# Patient Record
Sex: Female | Born: 1960 | Race: Black or African American | Hispanic: No | Marital: Married | Smoking: Never smoker
Health system: Southern US, Community
[De-identification: ages and names within clinical notes are randomized; demographics above are authoritative.]

## PROBLEM LIST (undated history)

## (undated) DIAGNOSIS — M1990 Unspecified osteoarthritis, unspecified site: Secondary | ICD-10-CM

## (undated) DIAGNOSIS — I10 Essential (primary) hypertension: Secondary | ICD-10-CM

## (undated) DIAGNOSIS — K219 Gastro-esophageal reflux disease without esophagitis: Secondary | ICD-10-CM

## (undated) DIAGNOSIS — E785 Hyperlipidemia, unspecified: Secondary | ICD-10-CM

## (undated) DIAGNOSIS — J302 Other seasonal allergic rhinitis: Secondary | ICD-10-CM

## (undated) HISTORY — DX: Unspecified osteoarthritis, unspecified site: M19.90

## (undated) HISTORY — PX: BARTHOLIN GLAND CYST EXCISION: SHX565

## (undated) HISTORY — PX: OTHER SURGICAL HISTORY: SHX169

## (undated) HISTORY — DX: Hyperlipidemia, unspecified: E78.5

---

## 1982-06-02 HISTORY — PX: CERVICAL POLYPECTOMY: SHX88

## 2003-09-28 ENCOUNTER — Other Ambulatory Visit: Admission: RE | Admit: 2003-09-28 | Discharge: 2003-09-28 | Payer: Self-pay | Admitting: *Deleted

## 2004-10-01 ENCOUNTER — Other Ambulatory Visit: Admission: RE | Admit: 2004-10-01 | Discharge: 2004-10-01 | Payer: Self-pay | Admitting: *Deleted

## 2004-11-01 ENCOUNTER — Encounter: Admission: RE | Admit: 2004-11-01 | Discharge: 2004-11-01 | Payer: Self-pay | Admitting: *Deleted

## 2005-10-06 ENCOUNTER — Other Ambulatory Visit: Admission: RE | Admit: 2005-10-06 | Discharge: 2005-10-06 | Payer: Self-pay | Admitting: Obstetrics & Gynecology

## 2005-12-10 ENCOUNTER — Encounter: Admission: RE | Admit: 2005-12-10 | Discharge: 2005-12-10 | Payer: Self-pay | Admitting: Obstetrics & Gynecology

## 2005-12-23 ENCOUNTER — Encounter: Admission: RE | Admit: 2005-12-23 | Discharge: 2005-12-23 | Payer: Self-pay | Admitting: Obstetrics & Gynecology

## 2007-03-01 ENCOUNTER — Encounter: Admission: RE | Admit: 2007-03-01 | Discharge: 2007-03-01 | Payer: Self-pay | Admitting: Obstetrics & Gynecology

## 2007-03-29 ENCOUNTER — Other Ambulatory Visit: Admission: RE | Admit: 2007-03-29 | Discharge: 2007-03-29 | Payer: Self-pay | Admitting: Obstetrics and Gynecology

## 2008-03-06 ENCOUNTER — Encounter: Admission: RE | Admit: 2008-03-06 | Discharge: 2008-03-06 | Payer: Self-pay | Admitting: Obstetrics & Gynecology

## 2008-04-14 ENCOUNTER — Other Ambulatory Visit: Admission: RE | Admit: 2008-04-14 | Discharge: 2008-04-14 | Payer: Self-pay | Admitting: Obstetrics and Gynecology

## 2009-04-11 ENCOUNTER — Encounter: Admission: RE | Admit: 2009-04-11 | Discharge: 2009-04-11 | Payer: Self-pay | Admitting: Obstetrics & Gynecology

## 2010-04-15 ENCOUNTER — Encounter: Admission: RE | Admit: 2010-04-15 | Discharge: 2010-04-15 | Payer: Self-pay | Admitting: Obstetrics & Gynecology

## 2011-04-21 ENCOUNTER — Other Ambulatory Visit: Payer: Self-pay | Admitting: Obstetrics & Gynecology

## 2011-04-21 DIAGNOSIS — Z1231 Encounter for screening mammogram for malignant neoplasm of breast: Secondary | ICD-10-CM

## 2011-04-28 ENCOUNTER — Ambulatory Visit
Admission: RE | Admit: 2011-04-28 | Discharge: 2011-04-28 | Disposition: A | Discharge status: Home / Self Care | Payer: BC Managed Care – PPO | Source: Ambulatory Visit | Attending: Obstetrics & Gynecology | Admitting: Obstetrics & Gynecology

## 2011-04-28 DIAGNOSIS — Z1231 Encounter for screening mammogram for malignant neoplasm of breast: Secondary | ICD-10-CM

## 2011-05-07 ENCOUNTER — Encounter (INDEPENDENT_AMBULATORY_CARE_PROVIDER_SITE_OTHER): Payer: BC Managed Care – PPO | Admitting: Internal Medicine

## 2011-05-07 ENCOUNTER — Other Ambulatory Visit (INDEPENDENT_AMBULATORY_CARE_PROVIDER_SITE_OTHER): Payer: BC Managed Care – PPO

## 2011-05-07 DIAGNOSIS — I10 Essential (primary) hypertension: Secondary | ICD-10-CM

## 2011-05-07 DIAGNOSIS — E7800 Pure hypercholesterolemia, unspecified: Secondary | ICD-10-CM

## 2011-05-07 DIAGNOSIS — Z0000 Encounter for general adult medical examination without abnormal findings: Secondary | ICD-10-CM

## 2011-09-24 ENCOUNTER — Encounter: Payer: Self-pay | Admitting: Internal Medicine

## 2011-09-24 ENCOUNTER — Ambulatory Visit (INDEPENDENT_AMBULATORY_CARE_PROVIDER_SITE_OTHER): Payer: BC Managed Care – PPO | Admitting: Internal Medicine

## 2011-09-24 VITALS — BP 140/79 | HR 63 | Temp 97.8°F | Resp 16 | Ht 65.5 in | Wt 162.4 lb

## 2011-09-24 DIAGNOSIS — E785 Hyperlipidemia, unspecified: Secondary | ICD-10-CM

## 2011-09-24 DIAGNOSIS — I10 Essential (primary) hypertension: Secondary | ICD-10-CM

## 2011-09-24 LAB — LIPID PANEL
HDL: 54 mg/dL (ref >39)
LDL Cholesterol: 125 mg/dL — ABNORMAL HIGH (ref 0–99)

## 2011-09-24 LAB — C-REACTIVE PROTEIN: CRP: 0.46 mg/dL (ref <0.60)

## 2011-09-24 NOTE — Progress Notes (Signed)
  Subjective:    Patient ID: Valerie Jenkins, female    DOB: 1961/01/13, 51 y.o.   MRN: [id number]  HPIFollowup from December 2012 Pravachol was discontinued to see if myalgias and joint pains would go away and they did She is here today to assess lipids and inflammatory markers so we can decide if we try a second medicine She still has not lost significant weight Her eating habits are poor She doesn't exercise at all She has recently had this which responded to increasing her Zyrtec and taking Benadryl at bedtime    Review of Systems     Objective:   Physical Exam Vital signs are stable        Assessment & Plan:  Problem #1 hyperlipidemia Problem #2 hypertension Problem #3 myalgias from Pravachol  Check lipid profile and CPK and send letter with plan She asked about the necessity for stress test and I suggested we do this only if she begins to have easy fatigability although her exercise profile may be too low to assess this/she does have a family history of heart disease

## 2011-09-25 ENCOUNTER — Encounter: Payer: Self-pay | Admitting: Internal Medicine

## 2011-10-20 ENCOUNTER — Telehealth: Payer: Self-pay

## 2011-10-20 NOTE — Telephone Encounter (Signed)
PT STATES SHE HAD LAB WORK DONE FOR DR DOOLITTLE TO SEE WHETHER HER CHOL MEDS WERE GOING TO BE CHANGED OR ADJUSTED. SHE USUALLY GETS A LETTER IN THE MAIL BUT SHE HASN'T HEARD FROM ANYONE. PLEASE CALL [phone]

## 2011-10-20 NOTE — Telephone Encounter (Signed)
See the letter dictated on 09/25/2011 about her labs/ it was sent to her home I HOPE

## 2011-10-20 NOTE — Telephone Encounter (Signed)
Dr. Merla Riches I do not see where there were any comments on patients labs. Please advise

## 2011-10-21 NOTE — Telephone Encounter (Signed)
FYI: sorry in didn't look in letters for those results. patietn notified and did say she received the letter but since you physically write on it she didn't know if you reviewed them. patient stated she was going to schedule follow up appt with you for 3 months and also she wanted to think about the exercise tolerance test and get back to you.

## 2011-11-22 ENCOUNTER — Encounter: Payer: Self-pay | Admitting: Emergency Medicine

## 2011-11-22 ENCOUNTER — Emergency Department (INDEPENDENT_AMBULATORY_CARE_PROVIDER_SITE_OTHER)
Admission: EM | Admit: 2011-11-22 | Discharge: 2011-11-22 | Disposition: A | Discharge status: Home / Self Care | Payer: BC Managed Care – PPO | Source: Home / Self Care | Attending: Family Medicine | Admitting: Family Medicine

## 2011-11-22 DIAGNOSIS — J069 Acute upper respiratory infection, unspecified: Secondary | ICD-10-CM

## 2011-11-22 HISTORY — DX: Essential (primary) hypertension: I10

## 2011-11-22 HISTORY — DX: Other seasonal allergic rhinitis: J30.2

## 2011-11-22 HISTORY — DX: Gastro-esophageal reflux disease without esophagitis: K21.9

## 2011-11-22 MED ORDER — SULFAMETHOXAZOLE-TRIMETHOPRIM 800-160 MG PO TABS: 1.0000 | ORAL_TABLET | Freq: Two times a day (BID) | ORAL | Status: AC

## 2011-11-22 MED ORDER — BENZONATATE 200 MG PO CAPS: 200.0000 mg | ORAL_CAPSULE | Freq: Every day | ORAL | Status: AC

## 2011-11-22 NOTE — ED Provider Notes (Signed)
History     CSN: [id number]  Arrival date & time 11/22/11  1342   First MD Initiated Contact with Patient 11/22/11 1432      Chief Complaint  Patient presents with  . Cough  . Nasal Congestion     HPI Comments: Patient complains of approximately 5 day history of gradually progressive URI symptoms beginning with a mild sore throat (now improved), followed by progressive nasal congestion.  A cough started about 4 days ago.  Complains of fatigue and initial myalgias.  Cough is now worse at night and generally non-productive during the day.  There has been no pleuritic pain or shortness of breath, but she does wheeze at times.  She now occasionally coughs until she gags.  She does not remember her last tetanus immunization.  The history is provided by the patient.    Past Medical History  Diagnosis Date  . Hypertension   . Seasonal allergies   . GERD (gastroesophageal reflux disease)     Past Surgical History  Procedure Date  . Cesarean section   . Bartholin cystectomy     Family History  Problem Relation Age of Onset  . Diabetes Mother   . Hypertension Mother   . Hyperlipidemia Mother   . Diabetes Father   . Hypertension Father   . Hyperlipidemia Father   . Stroke Father   . Hypertension Sister     History  Substance Use Topics  . Smoking status: Never Smoker   . Smokeless tobacco: Not on file  . Alcohol Use: Yes    OB History    Grav Para Term Preterm Abortions TAB SAB Ect Mult Living                  Review of Systems + sore throat, improved + cough No pleuritic pain + wheezing + nasal congestion + post-nasal drainage No sinus pain/pressure No itchy/red eyes No earache No hemoptysis No SOB No fever, + chills No nausea No vomiting No abdominal pain No diarrhea No urinary symptoms No skin rashes + fatigue + myalgias + headache Used OTC meds without relief (Mucinex DM) Allergies  Flexeril; Peanut-containing drug products; and Shellfish  allergy  Home Medications   Current Outpatient Rx  Name Route Sig Dispense Refill  . AZELASTINE HCL 0.05 % OP SOLN  1 drop 2 (two) times daily.    Marland Kitchen BENZONATATE 200 MG PO CAPS Oral Take 1 capsule (200 mg total) by mouth at bedtime. Take as needed for cough 12 capsule 0  . ESOMEPRAZOLE MAGNESIUM 40 MG PO CPDR Oral Take 40 mg by mouth daily before breakfast.    . LOSARTAN POTASSIUM-HCTZ 50-12.5 MG PO TABS Oral Take 1 tablet by mouth daily.    Marland Kitchen MONTELUKAST SODIUM 10 MG PO TABS Oral Take 10 mg by mouth at bedtime.    . SULFAMETHOXAZOLE-TRIMETHOPRIM 800-160 MG PO TABS Oral Take 1 tablet by mouth 2 (two) times daily. 14 tablet 0  . VITAMIN D (ERGOCALCIFEROL) 50000 UNITS PO CAPS Oral Take 50,000 Units by mouth.      BP 109/70  Pulse 110  Temp 98.3 F (36.8 C) (Oral)  Resp 18  Ht 5\' 5"  (1.651 m)  Wt 158 lb (71.668 kg)  BMI 26.29 kg/m2  SpO2 99%  Physical Exam Nursing notes and Vital Signs reviewed. Appearance:  Patient appears healthy, stated age, and in no acute distress Eyes:  Pupils are equal, round, and reactive to light and accomodation.  Extraocular movement is intact.  Conjunctivae  are not inflamed  Ears:  Canals normal.  Tympanic membranes normal.  Nose:  Mildly congested turbinates.  No sinus tenderness.  Pharynx:  Normal Neck:  Supple.  Slightly tender shotty anterior/posterior nodes are palpated bilaterally  Lungs:  Clear to auscultation.  Breath sounds are equal. Chest:  Tenderness to palpation over the mid-sternum.   Heart:  Regular rate and rhythm without murmurs, rubs, or gallops.  Abdomen:  Nontender without masses or hepatosplenomegaly.  Bowel sounds are present.  No CVA or flank tenderness.  Extremities:  No edema.  No calf tenderness Skin:  No rash present.   ED Course  Procedures  none      1. Acute upper respiratory infections of unspecified site;  ? pertussis      MDM  Begin Septra DS for one week.  Prescription written for Benzonatate [facility]) to  take at bedtime for night-time cough.  Take plain Mucinex (guaifenesin) twice daily for cough and congestion.  Increase fluid intake, rest. Also recommend using saline nasal spray several times daily and saline nasal irrigation (AYR is a common brand) Stop all antihistamines for now, and other non-prescription cough/cold preparations.  Continue Singulair. Recommend a Tdap when well.  Follow-up with family doctor if not improving 7 to 10 days.         Lattie Haw, MD 11/22/11 [phone]

## 2011-11-22 NOTE — ED Notes (Signed)
Congestion and cough x 6 days; husband had same S&S prior to her onset.

## 2011-11-22 NOTE — Discharge Instructions (Signed)
Take plain Mucinex (guaifenesin) twice daily for cough and congestion.  Increase fluid intake, rest. Also recommend using saline nasal spray several times daily and saline nasal irrigation (AYR is a common brand) Stop all antihistamines for now, and other non-prescription cough/cold preparations.  Continue Singulair. Recommend a Tdap when well.  Follow-up with family doctor if not improving 7 to 10 days.

## 2011-12-24 ENCOUNTER — Ambulatory Visit: Payer: BC Managed Care – PPO | Admitting: Internal Medicine

## 2012-02-18 ENCOUNTER — Ambulatory Visit: Payer: BC Managed Care – PPO | Admitting: Internal Medicine

## 2012-04-05 ENCOUNTER — Telehealth: Payer: Self-pay

## 2012-04-05 NOTE — Telephone Encounter (Signed)
Faxed over the requested information to patient's employer.  The only immunization that we have on file for the patient is a TDAP.

## 2012-04-05 NOTE — Telephone Encounter (Signed)
Pt states she needs her immunization records sent for a new job. She is not sure if we have any copies of them. If we do she asks [place] to fax them to HR at the employer is getting a job with.   [facility] HR: Charolette Child   Phone [phone]   Fax - [phone]

## 2012-04-14 ENCOUNTER — Encounter: Payer: Self-pay | Admitting: Internal Medicine

## 2012-04-14 ENCOUNTER — Ambulatory Visit (INDEPENDENT_AMBULATORY_CARE_PROVIDER_SITE_OTHER): Payer: BC Managed Care – PPO | Admitting: Internal Medicine

## 2012-04-14 VITALS — BP 130/80 | HR 52 | Temp 97.9°F | Resp 16 | Ht 65.5 in | Wt 161.6 lb

## 2012-04-14 DIAGNOSIS — M170 Bilateral primary osteoarthritis of knee: Secondary | ICD-10-CM

## 2012-04-14 DIAGNOSIS — M1710 Unilateral primary osteoarthritis, unspecified knee: Secondary | ICD-10-CM

## 2012-04-14 DIAGNOSIS — E785 Hyperlipidemia, unspecified: Secondary | ICD-10-CM | POA: Insufficient documentation

## 2012-04-14 DIAGNOSIS — I10 Essential (primary) hypertension: Secondary | ICD-10-CM

## 2012-04-14 DIAGNOSIS — R609 Edema, unspecified: Secondary | ICD-10-CM

## 2012-04-14 DIAGNOSIS — K219 Gastro-esophageal reflux disease without esophagitis: Secondary | ICD-10-CM

## 2012-04-14 DIAGNOSIS — Z0000 Encounter for general adult medical examination without abnormal findings: Secondary | ICD-10-CM

## 2012-04-14 DIAGNOSIS — E569 Vitamin deficiency, unspecified: Secondary | ICD-10-CM

## 2012-04-14 DIAGNOSIS — J309 Allergic rhinitis, unspecified: Secondary | ICD-10-CM

## 2012-04-14 LAB — CBC WITH DIFFERENTIAL/PLATELET
Basophils Absolute: 0 10*3/uL (ref 0.0–0.1)
Basophils Relative: 0 % (ref 0–1)
Hemoglobin: 14.2 g/dL (ref 12.0–15.0)
MCHC: 34.7 g/dL (ref 30.0–36.0)
Monocytes Relative: 8 % (ref 3–12)
Neutro Abs: 4.1 10*3/uL (ref 1.7–7.7)
Neutrophils Relative %: 53 % (ref 43–77)
Platelets: 327 10*3/uL (ref 150–400)
RBC: 5.09 MIL/uL (ref 3.87–5.11)

## 2012-04-14 LAB — COMPREHENSIVE METABOLIC PANEL
ALT: 11 U/L (ref 0–35)
AST: 15 U/L (ref 0–37)
Albumin: 4.8 g/dL (ref 3.5–5.2)
Alkaline Phosphatase: 66 U/L (ref 39–117)
Glucose, Bld: 88 mg/dL (ref 70–99)
Potassium: 4 mEq/L (ref 3.5–5.3)
Sodium: 139 mEq/L (ref 135–145)
Total Protein: 7.6 g/dL (ref 6.0–8.3)

## 2012-04-14 LAB — LIPID PANEL
LDL Cholesterol: 155 mg/dL — ABNORMAL HIGH (ref 0–99)
VLDL: 32 mg/dL (ref 0–40)

## 2012-04-14 LAB — POCT SEDIMENTATION RATE: POCT SED RATE: 9 mm/hr (ref 0–22)

## 2012-04-14 MED ORDER — LOSARTAN POTASSIUM-HCTZ 50-12.5 MG PO TABS: 1.0000 | ORAL_TABLET | Freq: Every day | ORAL | Status: DC

## 2012-04-14 MED ORDER — FLUTICASONE PROPIONATE 50 MCG/ACT NA SUSP: 2.0000 | Freq: Every day | NASAL | Status: DC

## 2012-04-14 MED ORDER — MONTELUKAST SODIUM 10 MG PO TABS: 10.0000 mg | ORAL_TABLET | Freq: Every day | ORAL | Status: DC

## 2012-04-14 NOTE — Progress Notes (Signed)
  Subjective:    Patient ID: Valerie Jenkins, female    DOB: [birth year], 51 y.o.   MRN: [id number]  HPI    Review of Systems  Constitutional: Negative.   HENT: Positive for congestion, facial swelling, neck pain and sinus pressure.   Eyes: Negative.   Respiratory: Negative.   Cardiovascular: Negative.   Gastrointestinal: Negative.   Genitourinary: Negative.   Musculoskeletal: Positive for arthralgias.  Skin: Negative.   Neurological: Negative.   Hematological: Negative.   Psychiatric/Behavioral: Negative.        Objective:   Physical Exam        Assessment & Plan:

## 2012-04-14 NOTE — Progress Notes (Signed)
  Subjective:    Patient ID: Valerie Jenkins, female    DOB: 09-10-1960, 51 y.o.   MRN: [id number]  HPIAnnual examination Patient Active Problem List  Diagnosis  . HTN (hypertension)  . Hyperlipidemia  . AR (allergic rhinitis)  . GERD (gastroesophageal reflux disease)  . Osteoarthritis of both knees  . Vitamin deficiency    -  Overweight  Continues to feel well and do well Recent GYN checkup normal Uses Provera occasionally to start periods/has menorrhagia at times Colonoscopy 2011 Flu vaccine at work  Review of Systems Menorrhagia Allergic symptoms still active and Patanol and optivar are both too expensive Occasional neck pain with forward flexion while reading Continues with knee discomfort thought to be due to osteoarthritis of both knees Continues to have intermittent low-grade edema of both lower extremities that did not disappear after we discontinue Norvasc last year Remainder of 14 point review of systems is negative     Objective:   Physical Exam Vital signs stable except weight 161 pounds HEENT clear with no thyromegaly or lymphadenopathy Heart regular without murmur no carotid bruits Lungs clear Abdomen benign without organomegaly or masses Extremities with no sensory loss or motor loss and with full peripheral pulses Neurological intact Psychiatric intact       Assessment & Plan:   1. HTN (hypertension)  CBC with Differential, Comprehensive metabolic panel  2. Hyperlipidemia  Lipid panel  3. AR (allergic rhinitis)    4. GERD (gastroesophageal reflux disease)    5. Osteoarthritis of both knees  POCT SEDIMENTATION RATE, ANA  6. Vitamin deficiency  Vitamin D, 25-hydroxy   vit d   7. Edema    8.  Overweight  Discussed weight loss and exercise Will add Singulair to see effect on eyes/Add Flonase as well  Meds ordered this encounter  Medications  . losartan-hydrochlorothiazide (HYZAAR) 50-12.5 MG per tablet    Sig: Take 1 tablet by mouth daily.   Dispense:  90 tablet    Refill:  3  . montelukast (SINGULAIR) 10 MG tablet    Sig: Take 1 tablet (10 mg total) by mouth at bedtime.    Dispense:  90 tablet    Refill:  3  . fluticasone (FLONASE) 50 MCG/ACT nasal spray    Sig: Place 2 sprays into the nose daily.    Dispense:  16 g    Refill:  6   Notify labs

## 2012-04-15 LAB — ANTI-NUCLEAR AB-TITER (ANA TITER): ANA Titer 1: NEGATIVE

## 2012-04-15 LAB — ANA: Anti Nuclear Antibody(ANA): POSITIVE — AB

## 2012-04-15 LAB — VITAMIN D 25 HYDROXY (VIT D DEFICIENCY, FRACTURES): Vit D, 25-Hydroxy: 37 ng/mL (ref 30–89)

## 2012-04-19 ENCOUNTER — Encounter: Payer: Self-pay | Admitting: Internal Medicine

## 2012-05-11 ENCOUNTER — Telehealth: Payer: Self-pay

## 2012-05-11 DIAGNOSIS — I10 Essential (primary) hypertension: Secondary | ICD-10-CM

## 2012-05-11 NOTE — Telephone Encounter (Signed)
PT STATES DR DOOLITTLE STATED HER BP WAS HIGH AND WANTED TO KNOW IF SHE WANTED TO GO BACK ON SOME MEDICINE AND SHE DOES BUT DIDN'T WANT WHAT SHE WAS ON BEFORE. PLEASE CALL [phone]   [facility] IN [place]

## 2012-05-12 MED ORDER — HYDROCHLOROTHIAZIDE 12.5 MG PO CAPS: 12.5000 mg | ORAL_CAPSULE | Freq: Every day | ORAL | Status: DC

## 2012-05-12 NOTE — Telephone Encounter (Signed)
Ok cahnge to HCT 12.5 Meds ordered this encounter  Medications  . hydrochlorothiazide (MICROZIDE) 12.5 MG capsule    Sig: Take 1 capsule (12.5 mg total) by mouth daily.    Dispense:  90 capsule    Refill:  3

## 2012-05-13 ENCOUNTER — Telehealth: Payer: Self-pay | Admitting: Radiology

## 2012-05-13 DIAGNOSIS — E785 Hyperlipidemia, unspecified: Secondary | ICD-10-CM

## 2012-05-13 MED ORDER — ROSUVASTATIN CALCIUM 5 MG PO TABS: 5.0000 mg | ORAL_TABLET | Freq: Every day | ORAL | Status: DC

## 2012-05-13 NOTE — Telephone Encounter (Signed)
Thanks, I called patient to advise.

## 2012-05-13 NOTE — Telephone Encounter (Signed)
Patient has called back, to advise she did not need meds for her Blood Pressure. She is asking for Cholesterol meds. She was previously on Pravastatin, but had muscle aches with this.

## 2012-05-13 NOTE — Telephone Encounter (Signed)
Left message for patient to advise. She is told to keep track of her BP for the next 2-3 weeks and call me back with her readings.

## 2012-05-13 NOTE — Telephone Encounter (Signed)
This makes more sense Needs to start crestor 5 mg---sent to her pharmacy Will need recheck labs in 3-4 months

## 2012-09-20 ENCOUNTER — Encounter: Payer: Self-pay | Admitting: *Deleted

## 2012-09-20 ENCOUNTER — Encounter: Payer: Self-pay | Admitting: Certified Nurse Midwife

## 2012-09-20 ENCOUNTER — Other Ambulatory Visit: Payer: Self-pay | Admitting: Obstetrics and Gynecology

## 2012-09-20 NOTE — Telephone Encounter (Signed)
Aex scheduled 09/27/12 #5/0rf's approved to last pt.

## 2012-09-27 ENCOUNTER — Ambulatory Visit: Payer: BC Managed Care – PPO | Admitting: Gynecology

## 2012-10-18 ENCOUNTER — Ambulatory Visit: Payer: BC Managed Care – PPO | Admitting: Gynecology

## 2012-10-18 ENCOUNTER — Encounter: Payer: Self-pay | Admitting: Gynecology

## 2012-10-18 ENCOUNTER — Ambulatory Visit (INDEPENDENT_AMBULATORY_CARE_PROVIDER_SITE_OTHER): Payer: BC Managed Care – PPO | Admitting: Gynecology

## 2012-10-18 VITALS — Ht 65.5 in | Wt 173.0 lb

## 2012-10-18 DIAGNOSIS — Z01419 Encounter for gynecological examination (general) (routine) without abnormal findings: Secondary | ICD-10-CM

## 2012-10-18 DIAGNOSIS — Z124 Encounter for screening for malignant neoplasm of cervix: Secondary | ICD-10-CM

## 2012-10-18 DIAGNOSIS — N920 Excessive and frequent menstruation with regular cycle: Secondary | ICD-10-CM

## 2012-10-18 MED ORDER — MEDROXYPROGESTERONE ACETATE 10 MG PO TABS: 10.0000 mg | ORAL_TABLET | Freq: Every day | ORAL | Status: DC

## 2012-10-18 NOTE — Progress Notes (Signed)
52 y.o.   Married    [place]   female   [id number]   here for annual exam.  Pt taking provera and continues to withdraw after each treatment 4-5d, +clots-cherry tomato size, no pain, minimal dysmenorrhea.  Improvement over last year before provera. Did not take December-May No post-coital bleeding.  LMP: 10/03/12          Sexually active: yes  The current method of family planning is vasectomy.    Exercising: not regularly Last mammogram:  2012 Last pap smear: 04/2011 History of abnormal pap: no Smoking: no Alcohol: no Last colonoscopy: 2011-normal Last Bone Density: none  Last tetanus shot: 04/2011  Last cholesterol check: 04/2011 BSE: no  Hgb:   PCP             Urine: pcp   Family History  Problem Relation Age of Onset  . Diabetes Mother   . Hypertension Mother   . Hyperlipidemia Mother   . Osteoporosis Mother   . Diabetes Father   . Hypertension Father   . Hyperlipidemia Father   . Stroke Father   . Hypertension Sister   . Arthritis Paternal Grandmother     Patient Active Problem List   Diagnosis Date Noted  . HTN (hypertension) 04/14/2012  . Hyperlipidemia 04/14/2012  . AR (allergic rhinitis) 04/14/2012  . GERD (gastroesophageal reflux disease) 04/14/2012  . Osteoarthritis of both knees 04/14/2012  . Vitamin deficiency 04/14/2012    Past Medical History  Diagnosis Date  . Hypertension   . Seasonal allergies   . GERD (gastroesophageal reflux disease)   . Arthritis   . Hyperlipidemia     Past Surgical History  Procedure Laterality Date  . Bartholin cystectomy    . Bartholin gland cyst excision    . Cervical polypectomy  1/84  . Cesarean section  1996    Allergies: Flexeril; Peanut-containing drug products; and Shellfish allergy  Current Outpatient Prescriptions  Medication Sig Dispense Refill  . azelastine (OPTIVAR) 0.05 % ophthalmic solution 1 drop 2 (two) times daily.      . Cetirizine HCl (ZYRTEC ALLERGY PO) Take by mouth as needed.      Marland Kitchen  esomeprazole (NEXIUM) 40 MG capsule Take 40 mg by mouth daily before breakfast.      . fluticasone (FLONASE) 50 MCG/ACT nasal spray Place 2 sprays into the nose daily.  16 g  6  . hydrochlorothiazide (MICROZIDE) 12.5 MG capsule Take 12.5 mg by mouth daily.      Marland Kitchen LOSARTAN POTASSIUM PO Take by mouth.      . medroxyPROGESTERone (PROVERA) 10 MG tablet TAKE ONE TABLET BY MOUTH DAILY FOR 5 DAYS EACH MONTH  5 tablet  0  . montelukast (SINGULAIR) 10 MG tablet Take 1 tablet (10 mg total) by mouth at bedtime.  90 tablet  3  . rosuvastatin (CRESTOR) 5 MG tablet Take 1 tablet (5 mg total) by mouth at bedtime.  30 tablet  11  . Vitamin D, Ergocalciferol, (DRISDOL) 50000 UNITS CAPS Take 50,000 Units by mouth.       No current facility-administered medications for this visit.    ROS: Pertinent items are noted in HPI.  Social Hx:    Exam:    There were no vitals taken for this visit.   Wt Readings from Last 3 Encounters:  04/14/12 161 lb 9.6 oz (73.301 kg)  11/22/11 158 lb (71.668 kg)  09/24/11 162 lb 6.4 oz (73.664 kg)     Ht Readings from Last 3  Encounters:  04/14/12 5' 5.5" (1.664 m)  11/22/11 5\' 5"  (1.651 m)  09/24/11 5' 5.5" (1.664 m)    General appearance: alert, cooperative and appears stated age Head: Normocephalic, without obvious abnormality, atraumatic Neck: no adenopathy, supple, symmetrical, trachea midline and thyroid not enlarged, symmetric, no tenderness/mass/nodules Lungs: clear to auscultation bilaterally Breasts: Inspection negative, No nipple retraction or dimpling, No nipple discharge or bleeding, No axillary or supraclavicular adenopathy, Normal to palpation without dominant masses Heart: regular rate and rhythm Abdomen: soft, non-tender; bowel sounds normal; no masses,  no organomegaly Extremities: extremities normal, atraumatic, no cyanosis or edema Skin: Skin color, texture, turgor normal. No rashes or lesions Lymph nodes: Cervical, supraclavicular, and axillary nodes  normal. No abnormal inguinal nodes palpated Neurologic: Grossly normal   Pelvic: External genitalia:  no lesions              Urethra:  normal appearing urethra with no masses, tenderness or lesions              Bartholins and Skenes: normal                 Vagina: normal appearing vagina with normal color and discharge, no lesions              Cervix: normal appearance              Pap taken: yes        Bimanual Exam:  Uterus: normal size, shape, consistency and nontender                                      Adnexa: normal adnexa in size, nontender and no masses                                      Rectovaginal: Confirms                                      Anus:  normal sphincter tone, no lesions  A: normal peri-menopausal exam menorrhagia     P: mammogram q year, stressed importance of breast self exam pap smear with HRHPV Stressed importance of compliance with provera return annually or prn     An After Visit Summary was printed and given to the patient.

## 2012-10-18 NOTE — Patient Instructions (Addendum)

## 2012-10-27 ENCOUNTER — Ambulatory Visit: Payer: BC Managed Care – PPO | Admitting: Gynecology

## 2012-11-09 ENCOUNTER — Other Ambulatory Visit: Payer: Self-pay

## 2012-11-09 DIAGNOSIS — Z1231 Encounter for screening mammogram for malignant neoplasm of breast: Secondary | ICD-10-CM

## 2012-12-13 ENCOUNTER — Ambulatory Visit
Admission: RE | Admit: 2012-12-13 | Discharge: 2012-12-13 | Disposition: A | Discharge status: Home / Self Care | Payer: BC Managed Care – PPO | Source: Ambulatory Visit

## 2012-12-13 DIAGNOSIS — Z1231 Encounter for screening mammogram for malignant neoplasm of breast: Secondary | ICD-10-CM

## 2013-02-19 ENCOUNTER — Telehealth: Payer: Self-pay

## 2013-02-19 NOTE — Telephone Encounter (Signed)
[facility] is requesting a Rx for an EPIPEN Auto-inject 2 pk. Dr Merla Riches, do you want to Rx this for pt? Pt has not been in to see you since 04/2012. Chart does show peanut and shellfish allergies.

## 2013-02-21 NOTE — Telephone Encounter (Signed)
Ok to call that in

## 2013-02-22 MED ORDER — EPINEPHRINE 0.3 MG/0.3ML IJ SOAJ: 0.3000 mg | Freq: Once | INTRAMUSCULAR | Status: DC

## 2013-02-22 NOTE — Telephone Encounter (Signed)
Sent in. Error [facility] to [facility]/ called [facility] to cancel the rx and resent to [facility].

## 2013-02-28 ENCOUNTER — Telehealth: Payer: Self-pay

## 2013-02-28 DIAGNOSIS — E785 Hyperlipidemia, unspecified: Secondary | ICD-10-CM

## 2013-02-28 DIAGNOSIS — N920 Excessive and frequent menstruation with regular cycle: Secondary | ICD-10-CM

## 2013-02-28 MED ORDER — LOSARTAN POTASSIUM-HCTZ 50-12.5 MG PO TABS: 1.0000 | ORAL_TABLET | Freq: Every day | ORAL | Status: DC

## 2013-02-28 MED ORDER — ROSUVASTATIN CALCIUM 5 MG PO TABS: 5.0000 mg | ORAL_TABLET | Freq: Every day | ORAL | Status: DC

## 2013-02-28 MED ORDER — MEDROXYPROGESTERONE ACETATE 10 MG PO TABS: 10.0000 mg | ORAL_TABLET | Freq: Every day | ORAL | Status: DC

## 2013-02-28 MED ORDER — MONTELUKAST SODIUM 10 MG PO TABS: 10.0000 mg | ORAL_TABLET | Freq: Every day | ORAL | Status: DC

## 2013-02-28 NOTE — Telephone Encounter (Signed)
Authorized.  Meds ordered this encounter  Medications  . rosuvastatin (CRESTOR) 5 MG tablet    Sig: Take 1 tablet (5 mg total) by mouth at bedtime.    Dispense:  90 tablet    Refill:  0  . montelukast (SINGULAIR) 10 MG tablet    Sig: Take 1 tablet (10 mg total) by mouth at bedtime.    Dispense:  90 tablet    Refill:  0  . medroxyPROGESTERone (PROVERA) 10 MG tablet    Sig: Take 1 tablet (10 mg total) by mouth daily. Take day 1-10 po per month    Dispense:  30 tablet    Refill:  0  . losartan-hydrochlorothiazide (HYZAAR) 50-12.5 MG per tablet    Sig: Take 1 tablet by mouth daily.    Dispense:  90 tablet    Refill:  0

## 2013-02-28 NOTE — Telephone Encounter (Signed)
[facility] sent req for 90 day RFs of Crestor, montelukast,medroxyprogesterone, and losartan-hctz. Pt has appt sch for CPE on 04/20/13. Can we give her the 90 day RFs? I have pended Rxs.

## 2013-04-07 ENCOUNTER — Other Ambulatory Visit: Payer: Self-pay

## 2013-04-20 ENCOUNTER — Ambulatory Visit (INDEPENDENT_AMBULATORY_CARE_PROVIDER_SITE_OTHER): Payer: BC Managed Care – PPO | Admitting: Internal Medicine

## 2013-04-20 VITALS — BP 110/64 | HR 80 | Temp 98.1°F | Resp 16 | Ht 65.5 in | Wt 167.0 lb

## 2013-04-20 DIAGNOSIS — E785 Hyperlipidemia, unspecified: Secondary | ICD-10-CM

## 2013-04-20 DIAGNOSIS — I10 Essential (primary) hypertension: Secondary | ICD-10-CM

## 2013-04-20 DIAGNOSIS — J301 Allergic rhinitis due to pollen: Secondary | ICD-10-CM

## 2013-04-20 DIAGNOSIS — E559 Vitamin D deficiency, unspecified: Secondary | ICD-10-CM

## 2013-04-20 DIAGNOSIS — J309 Allergic rhinitis, unspecified: Secondary | ICD-10-CM

## 2013-04-20 DIAGNOSIS — K219 Gastro-esophageal reflux disease without esophagitis: Secondary | ICD-10-CM

## 2013-04-20 DIAGNOSIS — E569 Vitamin deficiency, unspecified: Secondary | ICD-10-CM

## 2013-04-20 DIAGNOSIS — Z0000 Encounter for general adult medical examination without abnormal findings: Secondary | ICD-10-CM

## 2013-04-20 LAB — COMPREHENSIVE METABOLIC PANEL
ALT: 19 U/L (ref 0–35)
AST: 16 U/L (ref 0–37)
Albumin: 4 g/dL (ref 3.5–5.2)
CO2: 29 mEq/L (ref 19–32)
Calcium: 9.8 mg/dL (ref 8.4–10.5)
Chloride: 102 mEq/L (ref 96–112)
Creat: 0.92 mg/dL (ref 0.50–1.10)
Glucose, Bld: 100 mg/dL — ABNORMAL HIGH (ref 70–99)
Potassium: 4.2 mEq/L (ref 3.5–5.3)
Sodium: 137 mEq/L (ref 135–145)
Total Protein: 6.9 g/dL (ref 6.0–8.3)

## 2013-04-20 LAB — CBC WITH DIFFERENTIAL/PLATELET
Eosinophils Relative: 2 % (ref 0–5)
HCT: 40.5 % (ref 36.0–46.0)
Lymphocytes Relative: 33 % (ref 12–46)
Lymphs Abs: 1.9 10*3/uL (ref 0.7–4.0)
MCV: 81.5 fL (ref 78.0–100.0)
Monocytes Relative: 7 % (ref 3–12)
Neutro Abs: 3.3 10*3/uL (ref 1.7–7.7)
Platelets: 286 10*3/uL (ref 150–400)
RBC: 4.97 MIL/uL (ref 3.87–5.11)
WBC: 5.8 10*3/uL (ref 4.0–10.5)

## 2013-04-20 LAB — POCT URINALYSIS DIPSTICK
Bilirubin, UA: NEGATIVE
Glucose, UA: NEGATIVE
Leukocytes, UA: NEGATIVE
Nitrite, UA: NEGATIVE
Protein, UA: NEGATIVE
Spec Grav, UA: 1.02
Urobilinogen, UA: 0.2

## 2013-04-20 LAB — LIPID PANEL: Cholesterol: 129 mg/dL (ref 0–200)

## 2013-04-20 MED ORDER — LOSARTAN POTASSIUM-HCTZ 50-12.5 MG PO TABS: 1.0000 | ORAL_TABLET | Freq: Every day | ORAL | Status: DC

## 2013-04-20 MED ORDER — AZELASTINE HCL 0.05 % OP SOLN: 1.0000 [drp] | Freq: Two times a day (BID) | OPHTHALMIC | Status: DC

## 2013-04-20 MED ORDER — MONTELUKAST SODIUM 10 MG PO TABS: 10.0000 mg | ORAL_TABLET | Freq: Every day | ORAL | Status: DC

## 2013-04-20 MED ORDER — FLUTICASONE PROPIONATE 50 MCG/ACT NA SUSP: 2.0000 | Freq: Every day | NASAL | Status: DC

## 2013-04-20 NOTE — Progress Notes (Signed)
Subjective:    Patient ID: Valerie Jenkins, female    DOB: 29-Aug-1960, 52 y.o.   MRN: [id number]  HPICPE  Patient Active Problem List   Diagnosis Date Noted  . HTN (hypertension) 04/14/2012  . Hyperlipidemia 04/14/2012  . AR (allergic rhinitis) 04/14/2012  . GERD (gastroesophageal reflux disease) 04/14/2012  . Osteoarthritis of both knees 04/14/2012  . Vitamin deficiency 04/14/2012  doing well imm utd [facility] utd Current outpatient prescriptions:Cetirizine HCl (ZYRTEC ALLERGY PO), Take by mouth as needed., Disp: , Rfl: ;  EPINEPHrine (EPI-PEN) 0.3 mg/0.3 mL SOAJ injection, Inject 0.3 mLs (0.3 mg total) into the muscle once., Disp: 2 Device, Rfl: 0;  esomeprazole (NEXIUM) 40 MG capsule, Take 40 mg by mouth daily before breakfast., Disp: , Rfl:  losartan-hydrochlorothiazide (HYZAAR) 50-12.5 MG per tablet, Take 1 tablet by mouth daily., Disp: 90 tablet, Rfl: 0;  medroxyPROGESTERone (PROVERA) 10 MG tablet, Take 1 tablet (10 mg total) by mouth daily. Take day 1-10 po per month, Disp: 30 tablet, Rfl: 0;  montelukast (SINGULAIR) 10 MG tablet, Take 1 tablet (10 mg total) by mouth at bedtime., Disp: 90 tablet, Rfl: 0 rosuvastatin (CRESTOR) 5 MG tablet, Take 1 tablet (5 mg total) by mouth at bedtime., Disp: 90 tablet, Rfl: 0;  azelastine (OPTIVAR) 0.05 % ophthalmic solution, Place 1 drop into both eyes 2 (two) times daily., Disp: 6 mL, Rfl: 11;  fluticasone (FLONASE) 50 MCG/ACT nasal spray, Place 2 sprays into both nostrils daily., Disp: 16 g, Rfl: 6 No side effects Gyn stable/mammo current     Review of Systems  Constitutional: Positive for fatigue and unexpected weight change.       Fatigue related to lots of work and no exercise/plus stress of teenage daughter and college decisions/sister-family issues  HENT: Positive for sinus pressure.        Hx AR  Eyes: Negative.   Respiratory: Negative.  Negative for cough, chest tightness and shortness of breath.   Cardiovascular: Negative.  Negative for  chest pain, palpitations and leg swelling.  Gastrointestinal: Negative.   Endocrine: Negative.   Genitourinary: Negative.  Negative for difficulty urinating.  Musculoskeletal: Negative.   Skin: Negative.   Allergic/Immunologic: Negative.   Neurological: Negative.  Negative for headaches.  Hematological: Negative.   Psychiatric/Behavioral: Negative.  Negative for sleep disturbance, dysphoric mood and decreased concentration. The patient is not nervous/anxious.        Objective:   Physical Exam  Nursing note and vitals reviewed. Constitutional: She is oriented to person, place, and time. She appears well-developed and well-nourished. No distress.  HENT:  Head: Normocephalic and atraumatic.  Right Ear: External ear normal.  Left Ear: External ear normal.  Nose: Nose normal.  Mouth/Throat: Oropharynx is clear and moist. No oropharyngeal exudate.  Eyes: Conjunctivae and EOM are normal. Pupils are equal, round, and reactive to light.  Neck: Normal range of motion. Neck supple. No thyromegaly present.  Cardiovascular: Normal rate, regular rhythm, normal heart sounds and intact distal pulses.   No murmur heard. Pulmonary/Chest: Effort normal and breath sounds normal. No respiratory distress. She has no wheezes.  Abdominal: Soft. Bowel sounds are normal. She exhibits no distension and no mass. There is no tenderness. There is no rebound and no guarding.  Musculoskeletal: Normal range of motion. She exhibits no edema and no tenderness.  Lymphadenopathy:    She has no cervical adenopathy.  Neurological: She is alert and oriented to person, place, and time. She has normal reflexes. No cranial nerve deficit.  Skin: Skin  is warm and dry. No rash noted.  Psychiatric: She has a normal mood and affect. Her behavior is normal. Judgment and thought content normal.          Assessment & Plan:  Annual physical exam - Plan: POCT urinalysis dipstick  Unspecified vitamin D deficiency  AR  (allergic rhinitis) - Plan: montelukast (SINGULAIR) 10 MG tablet, azelastine (OPTIVAR) 0.05 % ophthalmic solution, fluticasone (FLONASE) 50 MCG/ACT nasal spray  GERD (gastroesophageal reflux disease)  HTN (hypertension) - Plan: losartan-hydrochlorothiazide (HYZAAR) 50-12.5 MG per tablet, CBC with Differential, Comprehensive metabolic panel  Hyperlipidemia - Plan: Lipid panel  Vitamin deficiency - Plan: TSH, Vitamin D, 25-hydroxy  Meds ordered this encounter  Medications  . losartan-hydrochlorothiazide (HYZAAR) 50-12.5 MG per tablet    Sig: Take 1 tablet by mouth daily.    Dispense:  90 tablet    Refill:  0  . montelukast (SINGULAIR) 10 MG tablet    Sig: Take 1 tablet (10 mg total) by mouth at bedtime.    Dispense:  90 tablet    Refill:  0  . azelastine (OPTIVAR) 0.05 % ophthalmic solution    Sig: Place 1 drop into both eyes 2 (two) times daily.    Dispense:  6 mL    Refill:  11  . fluticasone (FLONASE) 50 MCG/ACT nasal spray    Sig: Place 2 sprays into both nostrils daily.    Dispense:  16 g    Refill:  6

## 2013-04-21 LAB — VITAMIN D 25 HYDROXY (VIT D DEFICIENCY, FRACTURES): Vit D, 25-Hydroxy: 30 ng/mL (ref 30–89)

## 2013-04-21 LAB — TSH: TSH: 1.869 u[IU]/mL (ref 0.350–4.500)

## 2013-04-25 ENCOUNTER — Encounter: Payer: Self-pay | Admitting: Internal Medicine

## 2013-05-04 ENCOUNTER — Encounter: Payer: Self-pay | Admitting: Emergency Medicine

## 2013-05-04 ENCOUNTER — Emergency Department (INDEPENDENT_AMBULATORY_CARE_PROVIDER_SITE_OTHER)
Admission: EM | Admit: 2013-05-04 | Discharge: 2013-05-04 | Disposition: A | Discharge status: Home / Self Care | Payer: BC Managed Care – PPO | Source: Home / Self Care | Attending: Family Medicine | Admitting: Family Medicine

## 2013-05-04 ENCOUNTER — Encounter: Payer: BC Managed Care – PPO | Admitting: Internal Medicine

## 2013-05-04 DIAGNOSIS — J069 Acute upper respiratory infection, unspecified: Secondary | ICD-10-CM

## 2013-05-04 MED ORDER — BENZONATATE 200 MG PO CAPS: 200.0000 mg | ORAL_CAPSULE | Freq: Every day | ORAL | Status: DC

## 2013-05-04 MED ORDER — AMOXICILLIN 875 MG PO TABS: 875.0000 mg | ORAL_TABLET | Freq: Two times a day (BID) | ORAL | Status: DC

## 2013-05-04 NOTE — ED Notes (Signed)
Pt c/o cough, sore throat, x chest congestion x 3 days. Denies fever. She has taken Alka seltzer plus with some relief.

## 2013-05-04 NOTE — ED Provider Notes (Signed)
CSN: [id number]     Arrival date & time 05/04/13  1478 History   First MD Initiated Contact with Patient 05/04/13 [phone]     Chief Complaint  Patient presents with  . Sore Throat  . Cough      HPI Comments: Patient complains of 3 day history of fatigue, sinus congestion, sore throat, cough, chills, myalgias, and headache.  The cough is worse at night.  The history is provided by the patient.    Past Medical History  Diagnosis Date  . Hypertension   . Seasonal allergies   . GERD (gastroesophageal reflux disease)   . Arthritis   . Hyperlipidemia    Past Surgical History  Procedure Laterality Date  . Bartholin cystectomy    . Bartholin gland cyst excision    . Cervical polypectomy  1/84  . Cesarean section  1996   Family History  Problem Relation Age of Onset  . Diabetes Mother   . Hypertension Mother   . Hyperlipidemia Mother   . Osteoporosis Mother   . Diabetes Father   . Hypertension Father   . Hyperlipidemia Father   . Stroke Father   . Hypertension Sister   . Arthritis Paternal Grandmother    History  Substance Use Topics  . Smoking status: Never Smoker   . Smokeless tobacco: Not on file  . Alcohol Use: No   OB History   Grav Para Term Preterm Abortions TAB SAB Ect Mult Living   1 1 1       1      Review of Systems + sore throat + cough No pleuritic pain No wheezing + nasal congestion + post-nasal drainage No sinus pain/pressure No itchy/red eyes No earache No hemoptysis No SOB No fever, + chills No nausea No vomiting No abdominal pain No diarrhea No urinary symptoms No skin rash + fatigue + myalgias + headache Used OTC meds without relief  Allergies  Flexeril; Peanut-containing drug products; and Shellfish allergy  Home Medications   Current Outpatient Rx  Name  Route  Sig  Dispense  Refill  . amoxicillin (AMOXIL) 875 MG tablet   Oral   Take 1 tablet (875 mg total) by mouth 2 (two) times daily. (Rx void after 05/12/13)   20 tablet   0   . azelastine (OPTIVAR) 0.05 % ophthalmic solution   Both Eyes   Place 1 drop into both eyes 2 (two) times daily.   6 mL   11   . benzonatate (TESSALON) 200 MG capsule   Oral   Take 1 capsule (200 mg total) by mouth at bedtime. Take as needed for cough   12 capsule   0   . Cetirizine HCl (ZYRTEC ALLERGY PO)   Oral   Take by mouth as needed.         Marland Kitchen EPINEPHrine (EPI-PEN) 0.3 mg/0.3 mL SOAJ injection   Intramuscular   Inject 0.3 mLs (0.3 mg total) into the muscle once.   2 Device   0     If you use the device/ call 911 and go to the [facility] ...   . esomeprazole (NEXIUM) 40 MG capsule   Oral   Take 40 mg by mouth daily before breakfast.         . fluticasone (FLONASE) 50 MCG/ACT nasal spray   Each Nare   Place 2 sprays into both nostrils daily.   16 g   6   . losartan-hydrochlorothiazide (HYZAAR) 50-12.5 MG per tablet   Oral  Take 1 tablet by mouth daily.   90 tablet   0   . medroxyPROGESTERone (PROVERA) 10 MG tablet   Oral   Take 1 tablet (10 mg total) by mouth daily. Take day 1-10 po per month   30 tablet   0   . montelukast (SINGULAIR) 10 MG tablet   Oral   Take 1 tablet (10 mg total) by mouth at bedtime.   90 tablet   0   . rosuvastatin (CRESTOR) 5 MG tablet   Oral   Take 1 tablet (5 mg total) by mouth at bedtime.   90 tablet   0    BP 111/74  Pulse 80  Temp(Src) 98.2 F (36.8 C) (Oral)  Resp 18  Ht 5\' 5"  (1.651 m)  Wt 167 lb (75.751 kg)  BMI 27.79 kg/m2  SpO2 97%  LMP 04/16/2013 Physical Exam Nursing notes and Vital Signs reviewed. Appearance:  Patient appears healthy, stated age, and in no acute distress Eyes:  Pupils are equal, round, and reactive to light and accomodation.  Extraocular movement is intact.  Conjunctivae are not inflamed  Ears:  Canals normal.  Tympanic membranes normal.  Nose:  Mildly congested turbinates.  No sinus tenderness.   Pharynx:  Normal Neck:  Supple.  Slightly tender shotty posterior nodes are  palpated bilaterally  Lungs:  Clear to auscultation.  Breath sounds are equal.  Heart:  Regular rate and rhythm without murmurs, rubs, or gallops.  Abdomen:  Nontender without masses or hepatosplenomegaly.  Bowel sounds are present.  No CVA or flank tenderness.  Extremities:  No edema.  No calf tenderness Skin:  No rash present.   ED Course  Procedures           MDM   1. Acute upper respiratory infections of unspecified site    There is no evidence of bacterial infection today.  Treat symptomatically for now  Prescription written for Benzonatate (Tessalon) to take at bedtime for night-time cough.  Take Mucinex D (1200mg  guaifenesin with decongestant) twice daily for congestion.  Increase fluid intake, rest. May use Afrin nasal spray (or generic oxymetazoline) twice daily for about 5 days.  Also recommend using saline nasal spray several times daily and saline nasal irrigation (AYR is a common brand) Stop all antihistamines for now, and other non-prescription cough/cold preparations. Begin Amoxicillin if not improving about one week or if persistent fever develops (Given a prescription to hold, with an expiration date)  Follow-up with family doctor if not improving about10 days.    Lattie Haw, MD 05/07/13 [phone]

## 2013-05-08 ENCOUNTER — Telehealth: Payer: Self-pay | Admitting: Emergency Medicine

## 2013-05-11 ENCOUNTER — Other Ambulatory Visit: Payer: Self-pay | Admitting: Internal Medicine

## 2013-05-11 ENCOUNTER — Other Ambulatory Visit: Payer: Self-pay | Admitting: Physician Assistant

## 2013-09-01 ENCOUNTER — Telehealth: Payer: Self-pay

## 2013-09-01 DIAGNOSIS — I10 Essential (primary) hypertension: Secondary | ICD-10-CM

## 2013-09-01 DIAGNOSIS — J309 Allergic rhinitis, unspecified: Secondary | ICD-10-CM

## 2013-09-01 MED ORDER — MONTELUKAST SODIUM 10 MG PO TABS: 10.0000 mg | ORAL_TABLET | Freq: Every day | ORAL | Status: DC

## 2013-09-01 MED ORDER — MEDROXYPROGESTERONE ACETATE 10 MG PO TABS: ORAL_TABLET | ORAL | Status: DC

## 2013-09-01 MED ORDER — ROSUVASTATIN CALCIUM 5 MG PO TABS: ORAL_TABLET | ORAL | Status: DC

## 2013-09-01 MED ORDER — LOSARTAN POTASSIUM-HCTZ 50-12.5 MG PO TABS: 1.0000 | ORAL_TABLET | Freq: Every day | ORAL | Status: DC

## 2013-09-01 NOTE — Telephone Encounter (Signed)
[facility] called and req'd RFs on losartan HCTZ, singulair, provera, and crestor. Gave OK for 3 mos RF of each.

## 2013-12-09 ENCOUNTER — Other Ambulatory Visit: Payer: Self-pay

## 2013-12-09 DIAGNOSIS — Z1231 Encounter for screening mammogram for malignant neoplasm of breast: Secondary | ICD-10-CM

## 2013-12-12 ENCOUNTER — Other Ambulatory Visit: Payer: Self-pay | Admitting: Internal Medicine

## 2013-12-28 ENCOUNTER — Ambulatory Visit
Admission: RE | Admit: 2013-12-28 | Discharge: 2013-12-28 | Disposition: A | Discharge status: Home / Self Care | Payer: BC Managed Care – PPO | Source: Ambulatory Visit

## 2013-12-28 DIAGNOSIS — Z1231 Encounter for screening mammogram for malignant neoplasm of breast: Secondary | ICD-10-CM

## 2014-02-08 ENCOUNTER — Other Ambulatory Visit: Payer: Self-pay

## 2014-02-08 MED ORDER — LOSARTAN POTASSIUM-HCTZ 50-12.5 MG PO TABS: ORAL_TABLET | ORAL | Status: DC

## 2014-02-08 MED ORDER — ROSUVASTATIN CALCIUM 5 MG PO TABS: ORAL_TABLET | ORAL | Status: DC

## 2014-03-16 ENCOUNTER — Other Ambulatory Visit: Payer: Self-pay | Admitting: Internal Medicine

## 2014-03-21 ENCOUNTER — Other Ambulatory Visit: Payer: Self-pay | Admitting: Internal Medicine

## 2014-04-03 ENCOUNTER — Encounter: Payer: Self-pay | Admitting: Emergency Medicine

## 2014-04-19 ENCOUNTER — Other Ambulatory Visit: Payer: Self-pay | Admitting: Physician Assistant

## 2014-05-03 ENCOUNTER — Ambulatory Visit (INDEPENDENT_AMBULATORY_CARE_PROVIDER_SITE_OTHER): Payer: BC Managed Care – PPO | Admitting: Internal Medicine

## 2014-05-03 VITALS — BP 126/79 | HR 63 | Temp 98.0°F | Resp 16 | Ht 66.0 in | Wt 178.0 lb

## 2014-05-03 DIAGNOSIS — I10 Essential (primary) hypertension: Secondary | ICD-10-CM

## 2014-05-03 DIAGNOSIS — M791 Myalgia: Secondary | ICD-10-CM

## 2014-05-03 DIAGNOSIS — Z0000 Encounter for general adult medical examination without abnormal findings: Secondary | ICD-10-CM

## 2014-05-03 DIAGNOSIS — K2100 Gastro-esophageal reflux disease with esophagitis, without bleeding: Secondary | ICD-10-CM

## 2014-05-03 DIAGNOSIS — J302 Other seasonal allergic rhinitis: Secondary | ICD-10-CM

## 2014-05-03 DIAGNOSIS — R635 Abnormal weight gain: Secondary | ICD-10-CM

## 2014-05-03 DIAGNOSIS — IMO0001 Reserved for inherently not codable concepts without codable children: Secondary | ICD-10-CM

## 2014-05-03 DIAGNOSIS — R5383 Other fatigue: Secondary | ICD-10-CM

## 2014-05-03 DIAGNOSIS — M2550 Pain in unspecified joint: Secondary | ICD-10-CM

## 2014-05-03 DIAGNOSIS — E785 Hyperlipidemia, unspecified: Secondary | ICD-10-CM

## 2014-05-03 DIAGNOSIS — M609 Myositis, unspecified: Secondary | ICD-10-CM

## 2014-05-03 LAB — POCT URINALYSIS DIPSTICK
Bilirubin, UA: NEGATIVE
GLUCOSE UA: NEGATIVE
Ketones, UA: NEGATIVE
Leukocytes, UA: NEGATIVE
Nitrite, UA: NEGATIVE
PROTEIN UA: NEGATIVE
RBC UA: NEGATIVE
Urobilinogen, UA: 0.2
pH, UA: 6.5

## 2014-05-03 LAB — LIPID PANEL
CHOL/HDL RATIO: 3.1 ratio
CHOLESTEROL: 177 mg/dL (ref 0–200)
HDL: 58 mg/dL (ref >39)
LDL Cholesterol: 86 mg/dL (ref 0–99)
TRIGLYCERIDES: 166 mg/dL — AB (ref <150)
VLDL: 33 mg/dL (ref 0–40)

## 2014-05-03 LAB — COMPLETE METABOLIC PANEL WITH GFR
ALK PHOS: 75 U/L (ref 39–117)
ALT: 12 U/L (ref 0–35)
AST: 17 U/L (ref 0–37)
Albumin: 4 g/dL (ref 3.5–5.2)
BILIRUBIN TOTAL: 0.5 mg/dL (ref 0.2–1.2)
BUN: 12 mg/dL (ref 6–23)
CO2: 28 meq/L (ref 19–32)
CREATININE: 0.85 mg/dL (ref 0.50–1.10)
Calcium: 9.8 mg/dL (ref 8.4–10.5)
Chloride: 99 mEq/L (ref 96–112)
GFR, EST NON AFRICAN AMERICAN: 77 mL/min
GFR, Est African American: 89 mL/min
GLUCOSE: 103 mg/dL — AB (ref 70–99)
Potassium: 4.1 mEq/L (ref 3.5–5.3)
SODIUM: 136 meq/L (ref 135–145)
TOTAL PROTEIN: 6.7 g/dL (ref 6.0–8.3)

## 2014-05-03 LAB — TSH: TSH: 1.862 u[IU]/mL (ref 0.350–4.500)

## 2014-05-03 MED ORDER — MONTELUKAST SODIUM 10 MG PO TABS: 10.0000 mg | ORAL_TABLET | Freq: Every day | ORAL | Status: DC

## 2014-05-03 MED ORDER — LOSARTAN POTASSIUM-HCTZ 50-12.5 MG PO TABS: 1.0000 | ORAL_TABLET | Freq: Every day | ORAL | Status: DC

## 2014-05-03 MED ORDER — AZELASTINE HCL 0.05 % OP SOLN: 1.0000 [drp] | Freq: Two times a day (BID) | OPHTHALMIC | Status: DC

## 2014-05-03 MED ORDER — OMEPRAZOLE 40 MG PO CPDR: 40.0000 mg | DELAYED_RELEASE_CAPSULE | Freq: Every day | ORAL | Status: DC

## 2014-05-03 MED ORDER — ATORVASTATIN CALCIUM 10 MG PO TABS: 10.0000 mg | ORAL_TABLET | Freq: Every day | ORAL | Status: DC

## 2014-05-03 MED ORDER — CLONAZEPAM 0.5 MG PO TABS: 0.5000 mg | ORAL_TABLET | Freq: Every day | ORAL | Status: DC

## 2014-05-03 NOTE — Patient Instructions (Signed)
Tumeric for joints

## 2014-05-03 NOTE — Progress Notes (Signed)
Subjective:    Patient ID: Valerie Jenkins, female    DOB: 01/21/1961, 53 y.o.   MRN: [id number]  [facility] Patient Active Problem List   Diagnosis Date Noted  . HTN (hypertension) 04/14/2012  . Hyperlipidemia 04/14/2012  . AR (allergic rhinitis) 04/14/2012  . GERD (gastroesophageal reflux disease) 04/14/2012  . Osteoarthritis of both knees 04/14/2012  . Vitamin deficiency 04/14/2012   Stressed about work in about relationship with daughter both important Not enough time for exercise so weight has continued to increase Mammogram 7:15 Colonoscopy 2010 Pap smear 2014  Review of Systems  see 14 point review of systems form for positives related to fatigue,  food allergies with postnasal drip and facial swelling, and itching of eyes Occasional ankle swelling but not clearly edema Frequent constipation Frequent myalgias and arthralgias particularly in the hands shoulders knees Agitated easily with decreased concentration at work second-order to noise [employer]. End of October with menorrhagia and has been relatively regular over the past year    Objective:   Physical Exam  Constitutional: She is oriented to person, place, and time. She appears well-developed and well-nourished. No distress.  HENT:  Head: Normocephalic.  Right Ear: External ear normal.  Left Ear: External ear normal.  Nose: Nose normal.  Mouth/Throat: Oropharynx is clear and moist.  Eyes: Conjunctivae and EOM are normal. Pupils are equal, round, and reactive to light.  Neck: Normal range of motion. Neck supple. No thyromegaly present.  Cardiovascular: Normal rate, regular rhythm, normal heart sounds and intact distal pulses.   No murmur heard. Pulmonary/Chest: Effort normal and breath sounds normal. She has no wheezes.  Abdominal: Soft. Bowel sounds are normal. She exhibits no distension and no mass. There is no tenderness. There is no rebound.  Musculoskeletal: Normal range of motion. She exhibits no edema or  tenderness.  Lymphadenopathy:    She has no cervical adenopathy.  Neurological: She is alert and oriented to person, place, and time. She has normal reflexes. No cranial nerve deficit.  Skin: Skin is warm and dry. No rash noted.  Psychiatric: She has a normal mood and affect. Her behavior is normal. Judgment and thought content normal.  Nursing note and vitals reviewed.  Wt Readings from Last 3 Encounters:  05/03/14 178 lb (80.74 kg)  05/04/13 167 lb (75.751 kg)  04/20/13 167 lb (75.751 kg)  Assessment & Plan:  Annual physical exam  Other seasonal allergic rhinitis - Plan: azelastine (OPTIVAR) 0.05 % ophthalmic solution  Hyperlipidemia - Plan: Lipid panel  Other fatigue - Plan: EKG 12-Lead,  COMPLETE METABOLIC PANEL WITH GFR, CBC with Differential, POCT urinalysis dipstick, TSH, Sedimentation rate  Arthralgia  Myalgia and myositis - Plan: Vit D  25 hydroxy (rtn osteoporosis monitoring)  Weight gain - Plan: COMPLETE METABOLIC PANEL WITH GFR, TSH  Gastroesophageal reflux disease with esophagitis  Essential hypertension  Meds ordered this encounter  Medications  . omeprazole (PRILOSEC) 40 MG capsule    Sig: Take 1 capsule (40 mg total) by mouth daily.    Dispense:  90 capsule    Refill:  3  . clonazePAM (KLONOPIN) 0.5 MG tablet    Sig: Take 1 tablet (0.5 mg total) by mouth at bedtime.    Dispense:  30 tablet    Refill:  2  . azelastine (OPTIVAR) 0.05 % ophthalmic solution    Sig: Place 1 drop into both eyes 2 (two) times daily.    Dispense:  6 mL    Refill:  11  . losartan-hydrochlorothiazide (HYZAAR) 50-12.5 MG per tablet    Sig: Take 1 tablet by mouth daily.    Dispense:  90 tablet    Refill:  3  . montelukast (SINGULAIR) 10 MG tablet    Sig: Take 1 tablet (10 mg total) by mouth at bedtime.    Dispense:  90 tablet    Refill:  3  . atorvastatin (LIPITOR) 10 MG tablet    Sig: Take 1 tablet (10 mg total) by mouth daily.    Dispense:  90 tablet    Refill:  3    notify with labs

## 2014-05-04 LAB — CBC WITH DIFFERENTIAL/PLATELET
Basophils Absolute: 0 10*3/uL (ref 0.0–0.1)
Basophils Relative: 0 % (ref 0–1)
Eosinophils Absolute: 0.1 10*3/uL (ref 0.0–0.7)
Eosinophils Relative: 2 % (ref 0–5)
HEMATOCRIT: 41.1 % (ref 36.0–46.0)
Hemoglobin: 14 g/dL (ref 12.0–15.0)
LYMPHS ABS: 2.2 10*3/uL (ref 0.7–4.0)
Lymphocytes Relative: 31 % (ref 12–46)
MCH: 27.6 pg (ref 26.0–34.0)
MCHC: 34.1 g/dL (ref 30.0–36.0)
MCV: 80.9 fL (ref 78.0–100.0)
MONOS PCT: 9 % (ref 3–12)
MPV: 9.2 fL — AB (ref 9.4–12.4)
Monocytes Absolute: 0.6 10*3/uL (ref 0.1–1.0)
NEUTROS ABS: 4.2 10*3/uL (ref 1.7–7.7)
NEUTROS PCT: 58 % (ref 43–77)
Platelets: 266 10*3/uL (ref 150–400)
RBC: 5.08 MIL/uL (ref 3.87–5.11)
RDW: 14.9 % (ref 11.5–15.5)
WBC: 7.2 10*3/uL (ref 4.0–10.5)

## 2014-05-04 LAB — VITAMIN D 25 HYDROXY (VIT D DEFICIENCY, FRACTURES): Vit D, 25-Hydroxy: 21 ng/mL — ABNORMAL LOW (ref 30–100)

## 2014-05-04 LAB — SEDIMENTATION RATE

## 2014-05-05 ENCOUNTER — Telehealth: Payer: Self-pay

## 2014-05-05 NOTE — Telephone Encounter (Signed)
[facility] called. They did not have enough blood to run the ESR. Do you want pt to RTC?

## 2014-05-05 NOTE — Telephone Encounter (Signed)
No need for esr

## 2014-05-06 ENCOUNTER — Encounter: Payer: Self-pay | Admitting: Internal Medicine

## 2014-05-18 ENCOUNTER — Encounter: Payer: Self-pay | Admitting: Internal Medicine

## 2014-05-19 ENCOUNTER — Encounter: Payer: Self-pay | Admitting: Internal Medicine

## 2014-06-01 ENCOUNTER — Telehealth: Payer: Self-pay

## 2014-06-01 DIAGNOSIS — J309 Allergic rhinitis, unspecified: Secondary | ICD-10-CM

## 2014-06-01 NOTE — Telephone Encounter (Signed)
None of the Rx that were called in on 05/03/14 are at the pharmacy. She also needs a refill of flonase

## 2014-06-02 MED ORDER — FLUTICASONE PROPIONATE 50 MCG/ACT NA SUSP: 2.0000 | Freq: Every day | NASAL | Status: DC

## 2014-06-02 MED ORDER — ATORVASTATIN CALCIUM 10 MG PO TABS: 10.0000 mg | ORAL_TABLET | Freq: Every day | ORAL | Status: DC

## 2014-06-02 MED ORDER — LOSARTAN POTASSIUM-HCTZ 50-12.5 MG PO TABS: 1.0000 | ORAL_TABLET | Freq: Every day | ORAL | Status: DC

## 2014-06-02 MED ORDER — OMEPRAZOLE 40 MG PO CPDR: 40.0000 mg | DELAYED_RELEASE_CAPSULE | Freq: Every day | ORAL | Status: DC

## 2014-06-02 NOTE — Telephone Encounter (Signed)
Sent in for her, sent my chart message to advise.

## 2014-06-07 ENCOUNTER — Telehealth: Payer: Self-pay

## 2014-06-07 NOTE — Telephone Encounter (Signed)
Received fax that pt does not have [place] [facility] (form that was loaded on covermymeds and faxed to us). I called pharmacy and they advised they have that pt has [employer] coverage. I resubmitted w/the [employer] form. Pending.

## 2014-06-07 NOTE — Telephone Encounter (Signed)
PA needed for omeprazole, completed on covermymeds. Pending.

## 2014-06-08 NOTE — Telephone Encounter (Signed)
PA approved through 12/04/14. Notified pharm.

## 2015-01-02 ENCOUNTER — Other Ambulatory Visit: Payer: Self-pay

## 2015-01-02 DIAGNOSIS — Z1231 Encounter for screening mammogram for malignant neoplasm of breast: Secondary | ICD-10-CM

## 2015-01-19 ENCOUNTER — Ambulatory Visit
Admission: RE | Admit: 2015-01-19 | Discharge: 2015-01-19 | Disposition: A | Discharge status: Home / Self Care | Payer: BLUE CROSS/BLUE SHIELD | Source: Ambulatory Visit

## 2015-01-19 DIAGNOSIS — Z1231 Encounter for screening mammogram for malignant neoplasm of breast: Secondary | ICD-10-CM

## 2015-04-30 ENCOUNTER — Encounter: Payer: Self-pay | Admitting: Internal Medicine

## 2015-05-18 ENCOUNTER — Other Ambulatory Visit: Payer: Self-pay | Admitting: Internal Medicine

## 2015-05-28 ENCOUNTER — Other Ambulatory Visit: Payer: Self-pay

## 2015-05-28 DIAGNOSIS — J309 Allergic rhinitis, unspecified: Secondary | ICD-10-CM

## 2015-05-28 MED ORDER — LOSARTAN POTASSIUM-HCTZ 50-12.5 MG PO TABS: 1.0000 | ORAL_TABLET | Freq: Every day | ORAL | Status: DC

## 2015-05-28 MED ORDER — MONTELUKAST SODIUM 10 MG PO TABS: ORAL_TABLET | ORAL | Status: DC

## 2015-05-28 MED ORDER — ATORVASTATIN CALCIUM 10 MG PO TABS: 10.0000 mg | ORAL_TABLET | Freq: Every day | ORAL | Status: DC

## 2015-05-28 MED ORDER — OMEPRAZOLE 40 MG PO CPDR: 40.0000 mg | DELAYED_RELEASE_CAPSULE | Freq: Every day | ORAL | Status: DC

## 2015-05-28 MED ORDER — FLUTICASONE PROPIONATE 50 MCG/ACT NA SUSP: 2.0000 | Freq: Every day | NASAL | Status: DC

## 2015-06-08 ENCOUNTER — Other Ambulatory Visit: Payer: Self-pay | Admitting: Internal Medicine

## 2015-07-06 ENCOUNTER — Other Ambulatory Visit: Payer: Self-pay | Admitting: Internal Medicine

## 2015-07-28 ENCOUNTER — Ambulatory Visit (INDEPENDENT_AMBULATORY_CARE_PROVIDER_SITE_OTHER): Payer: BLUE CROSS/BLUE SHIELD | Admitting: Internal Medicine

## 2015-07-28 ENCOUNTER — Encounter: Payer: Self-pay | Admitting: Internal Medicine

## 2015-07-28 VITALS — BP 120/78 | HR 74 | Temp 97.9°F | Resp 14 | Ht 64.0 in | Wt 174.0 lb

## 2015-07-28 DIAGNOSIS — E785 Hyperlipidemia, unspecified: Secondary | ICD-10-CM | POA: Diagnosis not present

## 2015-07-28 DIAGNOSIS — I10 Essential (primary) hypertension: Secondary | ICD-10-CM | POA: Diagnosis not present

## 2015-07-28 DIAGNOSIS — M25561 Pain in right knee: Secondary | ICD-10-CM | POA: Diagnosis not present

## 2015-07-28 DIAGNOSIS — R5383 Other fatigue: Secondary | ICD-10-CM | POA: Diagnosis not present

## 2015-07-28 DIAGNOSIS — Z0000 Encounter for general adult medical examination without abnormal findings: Secondary | ICD-10-CM

## 2015-07-28 DIAGNOSIS — J302 Other seasonal allergic rhinitis: Secondary | ICD-10-CM

## 2015-07-28 DIAGNOSIS — M542 Cervicalgia: Secondary | ICD-10-CM | POA: Diagnosis not present

## 2015-07-28 DIAGNOSIS — K219 Gastro-esophageal reflux disease without esophagitis: Secondary | ICD-10-CM | POA: Diagnosis not present

## 2015-07-28 DIAGNOSIS — Z6829 Body mass index (BMI) 29.0-29.9, adult: Secondary | ICD-10-CM

## 2015-07-28 DIAGNOSIS — M25562 Pain in left knee: Secondary | ICD-10-CM

## 2015-07-28 LAB — COMPREHENSIVE METABOLIC PANEL
ALBUMIN: 4.3 g/dL (ref 3.6–5.1)
ALK PHOS: 76 U/L (ref 33–130)
ALT: 14 U/L (ref 6–29)
AST: 16 U/L (ref 10–35)
BILIRUBIN TOTAL: 0.4 mg/dL (ref 0.2–1.2)
BUN: 11 mg/dL (ref 7–25)
CALCIUM: 9.4 mg/dL (ref 8.6–10.4)
CO2: 27 mmol/L (ref 20–31)
Chloride: 100 mmol/L (ref 98–110)
Creat: 0.93 mg/dL (ref 0.50–1.05)
Glucose, Bld: 99 mg/dL (ref 65–99)
Potassium: 4.2 mmol/L (ref 3.5–5.3)
Sodium: 138 mmol/L (ref 135–146)
Total Protein: 7 g/dL (ref 6.1–8.1)

## 2015-07-28 LAB — CBC WITH DIFFERENTIAL/PLATELET
Basophils Absolute: 0 10*3/uL (ref 0.0–0.1)
Basophils Relative: 0 % (ref 0–1)
Eosinophils Absolute: 0.2 10*3/uL (ref 0.0–0.7)
Eosinophils Relative: 3 % (ref 0–5)
HEMATOCRIT: 42.2 % (ref 36.0–46.0)
HEMOGLOBIN: 15 g/dL (ref 12.0–15.0)
LYMPHS PCT: 39 % (ref 12–46)
Lymphs Abs: 2.5 10*3/uL (ref 0.7–4.0)
MCH: 28.9 pg (ref 26.0–34.0)
MCHC: 35.5 g/dL (ref 30.0–36.0)
MCV: 81.3 fL (ref 78.0–100.0)
MONO ABS: 0.6 10*3/uL (ref 0.1–1.0)
MONOS PCT: 9 % (ref 3–12)
MPV: 8.5 fL — ABNORMAL LOW (ref 8.6–12.4)
Neutro Abs: 3.1 10*3/uL (ref 1.7–7.7)
Neutrophils Relative %: 49 % (ref 43–77)
Platelets: 292 10*3/uL (ref 150–400)
RBC: 5.19 MIL/uL — AB (ref 3.87–5.11)
RDW: 13.8 % (ref 11.5–15.5)
WBC: 6.3 10*3/uL (ref 4.0–10.5)

## 2015-07-28 LAB — TSH: TSH: 1.52 mIU/L

## 2015-07-28 LAB — LIPID PANEL
Cholesterol: 198 mg/dL (ref 125–200)
HDL: 68 mg/dL (ref >=46)
LDL Cholesterol: 108 mg/dL (ref <130)
TRIGLYCERIDES: 112 mg/dL (ref <150)
Total CHOL/HDL Ratio: 2.9 Ratio (ref <=5.0)
VLDL: 22 mg/dL (ref <30)

## 2015-07-28 LAB — HEPATITIS C ANTIBODY: HCV Ab: NEGATIVE

## 2015-07-28 MED ORDER — MONTELUKAST SODIUM 10 MG PO TABS: ORAL_TABLET | ORAL | Status: DC

## 2015-07-28 MED ORDER — LOSARTAN POTASSIUM-HCTZ 50-12.5 MG PO TABS: 1.0000 | ORAL_TABLET | Freq: Every day | ORAL | Status: DC

## 2015-07-28 MED ORDER — ATORVASTATIN CALCIUM 10 MG PO TABS: 10.0000 mg | ORAL_TABLET | Freq: Every day | ORAL | Status: DC

## 2015-07-28 MED ORDER — CALCIUM CARB-CHOLECALCIFEROL 600-800 MG-UNIT PO TABS: 2.0000 | ORAL_TABLET | Freq: Every day | ORAL | Status: DC

## 2015-07-28 MED ORDER — OMEPRAZOLE 40 MG PO CPDR: 40.0000 mg | DELAYED_RELEASE_CAPSULE | Freq: Every day | ORAL | Status: DC

## 2015-07-28 MED ORDER — FLUTICASONE PROPIONATE 50 MCG/ACT NA SUSP: NASAL | Status: DC

## 2015-07-28 MED ORDER — AZELASTINE HCL 0.05 % OP SOLN: 1.0000 [drp] | Freq: Two times a day (BID) | OPHTHALMIC | Status: DC

## 2015-07-28 MED ORDER — EPINEPHRINE 0.3 MG/0.3ML IJ SOAJ: INTRAMUSCULAR | Status: DC

## 2015-07-28 NOTE — Progress Notes (Signed)
Subjective:  By signing my name below, I, Valerie Jenkins, attest that this documentation has been prepared under the direction and in the presence of Valerie Sia, MD.  Electronically Signed: Andrew Jenkins, ED Scribe. 07/28/2015. 10:09 AM.   Patient ID: Valerie Jenkins, female    DOB: 01/07/1961, 55 y.o.   MRN: [id number]  HPI Chief Complaint  Patient presents with  . Medication Refill    all meds  . OTHER    wellness check up    HPI Comments: Valerie Jenkins is a 55 y.o. female who presents to the [facility] for a physical and a medication refill and a physical. Pt is doing well. I have not seen her since 05/2014. She ran out of medications yesterday and is needing refills.    Mammogram - fall 2016 Pt had flu shot in 03/2015 [facility]- she plans to schedule an appointment with optho. she states her eyes are "changing" Exercise- not getting much exercise.  Arthralgias Pt is still expierencing intermittent joint pain in muscle aches in her knee and neck. She has tried yoga but did not like it. She's also tried zumba but states this was too much for her knees.   Palpitation She also c/o palpitation of irregular heart beat mostly at night. She thinks this maybe associated with caffeine. She occasionally drinks  coffee at night but states she more so tends to indulge in chocolate.  Light headedness She also reports light headedness. She states this maybe due to stress or possibly elevated BP. She admits to not drinking water like she should  Fatigue She has occasional fatigue, thought to be due to work. She denies falling asleep during the day while reading a book and while driving.   Memory Pt is also having issues with memory. Pt states her mind is constantly going.   Pt works in [employer], [employer]ery.    Past Medical History  Diagnosis Date  . Hypertension   . Seasonal allergies   . GERD (gastroesophageal reflux disease)   . Arthritis   . Hyperlipidemia     Past Surgical History  Procedure Laterality Date  . Bartholin cystectomy    . Bartholin gland cyst excision    . Cervical polypectomy  1/84  . Cesarean section  1996   Prior to Admission medications   Medication Sig Start Date End Date Taking? Authorizing Provider  atorvastatin (LIPITOR) 10 MG tablet Take 1 tablet (10 mg total) by mouth daily. 05/28/15  Yes Tonye Pearson, MD  azelastine (OPTIVAR) 0.05 % ophthalmic solution Place 1 drop into both eyes 2 (two) times daily. 05/03/14  Yes Tonye Pearson, MD  clonazePAM (KLONOPIN) 0.5 MG tablet Take 1 tablet (0.5 mg total) by mouth at bedtime. 05/03/14  Yes Tonye Pearson, MD  EPIPEN 2-PAK 0.3 MG/0.3ML SOAJ injection INJECT 0.3 ML (0.3 MG TOTAL) INTRAMUSCULARLY ONCE, IF YOU USE THE DEVICE, CALL 911 AND TO THE EMERGENCY ROOM 05/11/13  Yes Tonye Pearson, MD  fluticasone (FLONASE) 50 MCG/ACT nasal spray USE 2 SPRAYS IN EACH NOSTRIL DAILY (NEED OFFICE VISIT FOR ADDITIONAL REFILLS) 06/11/15  Yes Tonye Pearson, MD  losartan-hydrochlorothiazide (HYZAAR) 50-12.5 MG tablet Take 1 tablet by mouth daily. 05/28/15  Yes Tonye Pearson, MD  montelukast (SINGULAIR) 10 MG tablet TAKE ONE TABLET BY MOUTH NIGHTLY AT BEDTIME 05/28/15  Yes Tonye Pearson, MD  omeprazole (PRILOSEC) 40 MG capsule Take 1 capsule (40 mg total) by mouth daily. 05/28/15  Yes Molly Maduro  Clemencia Course, MD   Review of Systems  Constitutional: Positive for fatigue and unexpected weight change.  HENT: Positive for sinus pressure.   Eyes: Positive for visual disturbance.  Cardiovascular: Positive for palpitations.  Musculoskeletal: Positive for myalgias and arthralgias.  Neurological: Positive for light-headedness.  All other systems reviewed and are negative.     Objective:   Physical Exam  Constitutional: She is oriented to person, place, and time. She appears well-developed and well-nourished. No distress.  HENT:  Head: Normocephalic and atraumatic.  Right  Ear: External ear normal.  Left Ear: External ear normal.  Nose: Nose normal.  Mouth/Throat: Oropharynx is clear and moist.  Eyes: Conjunctivae and EOM are normal. Pupils are equal, round, and reactive to light.  Neck: Normal range of motion. Neck supple. No thyromegaly present.  Tender both traps and post cerv w/ good rom neck  Cardiovascular: Normal rate, regular rhythm, normal heart sounds and intact distal pulses.   No murmur heard. Pulmonary/Chest: Effort normal and breath sounds normal. No respiratory distress. She has no wheezes. She has no rales.  Abdominal: Soft. Bowel sounds are normal. She exhibits no distension and no mass. There is no tenderness. There is no rebound.  Musculoskeletal: Normal range of motion. She exhibits no edema or tenderness.  Pain around both knees with rom-tend infrapat and sl along lat jt lines w/out swell or red  Lymphadenopathy:    She has no cervical adenopathy.  Neurological: She is alert and oriented to person, place, and time. She has normal reflexes. No cranial nerve deficit.  Skin: Skin is warm and dry. No rash noted.  Psychiatric: She has a normal mood and affect. Her behavior is normal. Judgment and thought content normal.  Nursing note and vitals reviewed. mammo aug 16  Filed Vitals:   07/28/15 0943  BP: 120/78  Pulse: 74  Temp: 97.9 F (36.6 C)  TempSrc: Oral  Resp: 14  Height:  (1.626 m)  Weight: 174 lb (78.926 kg)  SpO2: 98%   Assessment & Plan:  Annual physical exam - Plan: CBC with Differential/Platelet, Comprehensive metabolic panel, Hepatitis C antibody  Other fatigue - Plan: CBC with Differential/Platelet, Comprehensive metabolic panel, TSH  Neck pain - Plan: Ambulatory referral to Physical Therapy  Arthralgia of both knees - Plan: Ambulatory referral to Physical Therapy  Hyperlipidemia - Plan: Lipid panel  BMI 29.0-29.9,adult  Essential hypertension  Other seasonal allergic rhinitis - Plan: azelastine  (OPTIVAR) 0.05 % ophthalmic solution  Gastroesophageal reflux disease without esophagitis  Meds ordered this encounter  Medications  . EPINEPHrine (EPIPEN 2-PAK) 0.3 mg/0.3 mL IJ SOAJ injection    Sig: INJECT 0.3 ML (0.3 MG TOTAL) INTRAMUSCULARLY ONCE, IF YOU USE THE DEVICE, CALL 911 AND TO THE EMERGENCY ROOM    Dispense:  1 Device    Refill:  11    Generic ok  . fluticasone (FLONASE) 50 MCG/ACT nasal spray    Sig: USE 2 SPRAYS IN EACH NOSTRIL DAILY    Dispense:  16 g    Refill:  11    PATIENT NEEDS OFFICE VISIT FOR ADDITIONAL REFILLS  . Calcium Carb-Cholecalciferol 600-800 MG-UNIT TABS    Sig: Take 2 capsules by mouth daily.    Dispense:  60 tablet    Refill:  11    3 mo supply if needed  . atorvastatin (LIPITOR) 10 MG tablet    Sig: Take 1 tablet (10 mg total) by mouth daily.    Dispense:  90 tablet  Refill:  3  . azelastine (OPTIVAR) 0.05 % ophthalmic solution    Sig: Place 1 drop into both eyes 2 (two) times daily.    Dispense:  6 mL    Refill:  11  . losartan-hydrochlorothiazide (HYZAAR) 50-12.5 MG tablet    Sig: Take 1 tablet by mouth daily.    Dispense:  90 tablet    Refill:  3  . montelukast (SINGULAIR) 10 MG tablet    Sig: TAKE ONE TABLET BY MOUTH NIGHTLY AT BEDTIME    Dispense:  90 tablet    Refill:  3  . omeprazole (PRILOSEC) 40 MG capsule    Sig: Take 1 capsule (40 mg total) by mouth daily.    Dispense:  90 capsule    Refill:  3

## 2015-08-27 ENCOUNTER — Encounter: Payer: Self-pay | Admitting: Rehabilitative and Restorative Service Providers"

## 2015-08-27 ENCOUNTER — Ambulatory Visit (INDEPENDENT_AMBULATORY_CARE_PROVIDER_SITE_OTHER): Payer: BLUE CROSS/BLUE SHIELD | Admitting: Rehabilitative and Restorative Service Providers"

## 2015-08-27 DIAGNOSIS — M623 Immobility syndrome (paraplegic): Secondary | ICD-10-CM | POA: Diagnosis not present

## 2015-08-27 DIAGNOSIS — M25562 Pain in left knee: Secondary | ICD-10-CM

## 2015-08-27 DIAGNOSIS — Z7409 Other reduced mobility: Secondary | ICD-10-CM

## 2015-08-27 DIAGNOSIS — M25561 Pain in right knee: Secondary | ICD-10-CM

## 2015-08-27 DIAGNOSIS — M2560 Stiffness of unspecified joint, not elsewhere classified: Secondary | ICD-10-CM

## 2015-08-27 DIAGNOSIS — R293 Abnormal posture: Secondary | ICD-10-CM | POA: Diagnosis not present

## 2015-08-27 NOTE — Patient Instructions (Signed)
Stand with knees straight but not locked!  Self-Mobilization: Inward Kneecap Push    With entire length of index finger along outer border of left kneecap, gently push kneecap in toward other leg. Hold __10-15__ seconds. Repeat __3-4__ times per set. Do __1-2__ sessions per day.   HIP: Hamstrings - Supine   Place strap around foot. Raise leg up, keeping knee straight.  Bend opposite knee to protect back if indicated. Hold 30 seconds. 3 reps per set, 2-3 sets per day     Outer Hip Stretch: Reclined IT Band Stretch (Strap)   Strap around one foot, pull leg across body until you feel a pull or stretch, with shoulders on mat. Hold for 30 seconds. Repeat 3 times each leg. 2-3 times/day.  Piriformis Stretch   Lying on back, pull right knee toward opposite shoulder. Hold 30 seconds. Repeat 3 times. Do 2-3 sessions per day.   Quads / HF, Supine   Lie near edge of bed, pull both knees up toward chest. Hold one knee as you drop the other leg off the edge of the bed.  Relax hanging knee/can bend knee back if indicated. Hold 30 seconds. Repeat 3 times per session. Do 2-3 sessions per day.    Quads / HF, Prone   Lie face down. Grasp one ankle with same-side hand. Use towel if needed to reach. Gently pull foot toward buttock.  Hold 30 seconds. Repeat 3 times per session. Do 2-3 sessions per day.  Bridging    Slowly raise buttocks from floor, keeping stomach tight. Repeat __10__ times per set. Do __1-2__ sets per session. Do __1__ sessions per day.

## 2015-08-27 NOTE — Therapy (Signed)
[facility] [address] [place], [place], [address] Phone: [phone]   Fax:  [phone]  Physical Therapy Evaluation  Patient Details  Name: Valerie Jenkins MRN: [id number] Date of Birth: 10-18-1960 Referring Provider: Dr. Ellamae Siaobert Doolittle  Encounter Date: 08/27/2015      PT End of Session - 08/27/15 0714    Visit Number 1   Number of Visits 6   Date for PT Re-Evaluation 10/08/15   PT Start Time 0721   PT Stop Time 0815   PT Time Calculation (min) 54 min   Activity Tolerance Patient tolerated treatment well      Past Medical History  Diagnosis Date  . Hypertension   . Seasonal allergies   . GERD (gastroesophageal reflux disease)   . Arthritis   . Hyperlipidemia     Past Surgical History  Procedure Laterality Date  . Bartholin cystectomy    . Bartholin gland cyst excision    . Cervical polypectomy  1/84  . Cesarean section  1996    There were no vitals filed for this visit.  Visit Diagnosis:  Arthralgia of both knees - Plan: PT plan of care cert/re-cert  Stiffness due to immobility - Plan: PT plan of care cert/re-cert  Postural imbalance - Plan: PT plan of care cert/re-cert  Decreased mobility and endurance - Plan: PT plan of care cert/re-cert      Subjective Assessment - 08/27/15 0722    Subjective Valerie Jenkins reports that she has pain in her knees for years. She had a physical and was discussing the knee and hip pain with her MD and was referred to PT.    Pertinent History neck pain    How long can you sit comfortably? no limit   How long can you stand comfortably? 2-3 hours    How long can you walk comfortably? 2-3 hours    Patient Stated Goals learn exercises for legs    Currently in Pain? No/denies   Pain Location Knee   Pain Orientation Right;Left   Pain Descriptors / Indicators Dull;Nagging   Pain Type Chronic pain   Pain Onset More than a month ago   Pain Frequency Intermittent   Aggravating Factors  stairs; squatting; wearing heels   Pain Relieving Factors changing positions             [facility] PT Assessment - 08/27/15 0001    Assessment   Medical Diagnosis Arthralgia bilat knees; neck pain    Referring Provider Dr. Ellamae Siaobert Doolittle   Onset Date/Surgical Date 11/01/10   Hand Dominance Right   Next MD Visit no return scheduled   Prior Therapy none   Precautions   Precautions None   Balance Screen   Has the patient fallen in the past 6 months No   Has the patient had a decrease in activity level because of a fear of falling?  No   Is the patient reluctant to leave their home because of a fear of falling?  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   Leisure household chores; yard work   Observation/Other Assessments   Focus on Therapeutic Outcomes (FOTO)  31% limitation    Posture/Postural Control   Posture Comments stands with knees hyperextended; head forward; increased thoracic kyphosis   AROM  Right/Left Hip --  WFL's   Right/Left Knee --  WFL's   Right/Left Ankle --  WFL's   Strength   Overall Strength Comments 5/5 bilat LE's    Flexibility   Hamstrings tight bilat ~ 75-80 deg    Quadriceps heel 8 inch from buttock bilat    ITB tight bilat    Piriformis tight bilat    Palpation   Patella mobility tight along lateral patella with muscular banding at lateral quad/distal IT band area Lt ? Rt    Palpation comment tightness noted through quads; ITB; hip abductors/piriformis    Ambulation/Gait   Gait Comments WFL's                    [facility] Adult PT Treatment/Exercise - 08/27/15 0001    Knee/Hip Exercises: Stretches   Passive Hamstring Stretch 3 reps;30 seconds   Quad Stretch 3 reps;30 seconds   Hip Flexor Stretch 2 reps;30 seconds    ITB Stretch 2 reps;30 seconds   Piriformis Stretch 2 reps;30 seconds   Knee/Hip Exercises: Supine   Bridges Both;10 reps  5-10 sec                 PT Education - 08/27/15 0747    Education provided Yes   Education Details HEP; standing without knees hyuperextended   Person(s) Educated Patient   Methods Explanation;Demonstration;Tactile cues;Verbal cues;Handout   Comprehension Verbalized understanding;Returned demonstration;Verbal cues required;Tactile cues required             PT Long Term Goals - 08/27/15 0803    PT LONG TERM GOAL #4   Title Evaluate neck pain as indicated 10/08/15   Time 6   Period Weeks   Status New               Plan - 08/27/15 0751    Clinical Impression Statement Valerie Jenkins persents with bilat knee and hip pain of several years duration. She has stiffness and tightness through neck as well - which she feels is related to sitting at a desk/computer all day. She has limited end range mobility and decreased tissue extensibility. Patient will benefit from PT to address sympotms.    Pt will benefit from skilled therapeutic intervention in order to improve on the following deficits Postural dysfunction;Improper body mechanics;Pain;Decreased range of motion;Decreased mobility;Decreased endurance;Decreased activity tolerance   Rehab Potential Good   PT Frequency 2x / week   PT Duration 6 weeks   PT Treatment/Interventions Patient/family education;ADLs/Self Care Home Management;Therapeutic exercise;Therapeutic activities;Neuromuscular re-education;Manual techniques;Dry needling;Cryotherapy;Electrical Stimulation;Moist Heat;Ultrasound   PT Next Visit Plan progress with LE strengthening    PT Home Exercise Plan HEP; avoid standing with knees hyperextended    Consulted and Agree with Plan of Care Patient         Problem List Patient Active Problem List   Diagnosis Date Noted  . BMI 29.0-29.9,adult 07/28/2015  . HTN (hypertension) 04/14/2012  .  Hyperlipidemia 04/14/2012  . AR (allergic rhinitis) 04/14/2012  . GERD (gastroesophageal reflux disease) 04/14/2012  . Osteoarthritis of both knees 04/14/2012  . Vitamin deficiency 04/14/2012    Celyn Rober Minion PT, MPH  08/27/2015, 9:46 AM  [facility] [address] [place], [place], [address] Phone: [phone]   Fax:  [phone]  Name: Valerie Jenkins MRN: [id number] Date of Birth: 12-May-1961

## 2015-09-10 ENCOUNTER — Encounter: Payer: Self-pay | Admitting: Rehabilitative and Restorative Service Providers"

## 2015-09-10 ENCOUNTER — Ambulatory Visit (INDEPENDENT_AMBULATORY_CARE_PROVIDER_SITE_OTHER): Payer: BLUE CROSS/BLUE SHIELD | Admitting: Rehabilitative and Restorative Service Providers"

## 2015-09-10 DIAGNOSIS — M25561 Pain in right knee: Secondary | ICD-10-CM

## 2015-09-10 DIAGNOSIS — M6281 Muscle weakness (generalized): Secondary | ICD-10-CM

## 2015-09-10 DIAGNOSIS — R293 Abnormal posture: Secondary | ICD-10-CM

## 2015-09-10 DIAGNOSIS — M25562 Pain in left knee: Secondary | ICD-10-CM

## 2015-09-10 NOTE — Patient Instructions (Signed)
Abdominal Bracing With Pelvic Floor (Hook-Lying)    With neutral spine, tighten pelvic floor and abdominals sucking belly button to back bone; tighten musculaes in low back at waist. Hold 10 sec  Repeat __10_ times. Do __several_ times a day. Progress to do this in sitting; standing; walking; and with functional activities   Quad Set    With other leg bent, foot flat, slowly tighten muscles on thigh of straight leg while counting out loud to __10__. Repeat with other leg. Repeat _10___ times. Do _1___ sessions per day.   Short Arc [employer] CompanyQuad    Place a large can or rolled towel under leg. Straighten knee and leg. Hold __3-5__ seconds. Repeat with other leg. Repeat __10__ times. Do __1__ sessions per day.    Straight Leg Raise: With External Leg Rotation    Lie on back with right leg straight, opposite leg bent. Rotate straight leg out and lift _8-10___ inches.hold 5-10 sec  Repeat _10_ times per set. Do __1-3__ sets per session. Do __1__ sessions per day.   Strengthening: Hip Adduction - Isometric    With ball or folded pillow between knees, squeeze knees together. Hold _5-10___ seconds. Repeat __10__ times per set. Do _1-3___ sets per session. Do __1__ sessions per day. Can bridge with ball squeeze   Place ball between knees; tighten core; pull knees toward chest; hold 3 sec; lower slowly. 10 reps 1 x/day

## 2015-09-10 NOTE — Therapy (Addendum)
[facility] [address] [place], [place], [address] Phone: [phone]   Fax:  [phone]  Physical Therapy Treatment  Patient Details  Name: Valerie RudBarbara T Jenkins MRN: [id number] Date of Birth: November 14, 1960 Referring Provider: Dr. Ellamae Siaobert Doolittle  Encounter Date: 09/10/2015      PT End of Session - 09/10/15 0711    Visit Number 2   Number of Visits 6   Date for PT Re-Evaluation 10/08/15   PT Start Time 0704   PT Stop Time 0801   PT Time Calculation (min) 57 min   Activity Tolerance Patient tolerated treatment well      Past Medical History  Diagnosis Date  . Hypertension   . Seasonal allergies   . GERD (gastroesophageal reflux disease)   . Arthritis   . Hyperlipidemia     Past Surgical History  Procedure Laterality Date  . Bartholin cystectomy    . Bartholin gland cyst excision    . Cervical polypectomy  1/84  . Cesarean section  1996    There were no vitals filed for this visit.      Subjective Assessment - 09/10/15 [id number]    Subjective Valerie MccreedyBarbara reports that her knees may be feeling a little better. Rt knee is typically worse than her Lt. She has been doing her exercises at home.    Currently in Pain? Yes   Pain Location Knee   Pain Orientation Left   Pain Descriptors / Indicators Tender   Pain Type Chronic pain   Pain Onset More than a month ago   Pain Frequency Intermittent                         [facility] Adult PT Treatment/Exercise - 09/10/15 0001    Knee/Hip Exercises: Stretches   Passive Hamstring Stretch 3 reps;30 seconds   Quad Stretch 3 reps;30 seconds   Hip Flexor Stretch 2 reps;30 seconds   ITB Stretch 2 reps;30 seconds   Piriformis Stretch 2 reps;30 seconds   Knee/Hip Exercises: Aerobic   Nustep L3 x 7 min    Knee/Hip Exercises: Supine   Quad Sets Right;Left;10 reps  10 sec   Short Arc Quad Sets Right;Left;10 reps   Bridges Both;10 reps  5-10 sec    Bridges with [employer]Ball  Squeeze Strengthening;Both;10 reps   Straight Leg Raises Right;Left;10 reps   Other Supine Knee/Hip Exercises knees to chest with adductor ball squeeze (core) x 10    Cryotherapy   Number Minutes Cryotherapy 10 Minutes   Cryotherapy Location Knee  bilat   Type of Cryotherapy Ice pack                PT Education - 09/10/15 0753    Education provided Yes   Education Details HEP   Person(s) Educated Patient   Methods Explanation;Demonstration;Tactile cues;Verbal cues;Handout   Comprehension Verbalized understanding;Returned demonstration;Verbal cues required;Tactile cues required             PT Long Term Goals - 09/10/15 0713    PT LONG TERM GOAL #1   Title Improve LE ROM and tissue extensibility 10/08/15    Time 6   Period Weeks   Status On-going   PT LONG TERM GOAL #2   Title Instruct pt in stretchin/strengthening program for HEP 10/08/15   Time 6   Period Weeks   Status On-going   PT LONG TERM GOAL #3   Title I in HEP 10/08/15   Time 6  Period Weeks   Status On-going   PT LONG TERM GOAL #4   Title Evaluate neck pain as indicated 10/08/15   Time 6   Period Weeks   Status On-going   PT LONG TERM GOAL #5   Title Improve FOTO to </= 32  % limitation 10/08/15    Time 6   Period Weeks   Status On-going               Plan - 09/10/15 0809    Clinical Impression Statement Progressing well with improved medial patellar mobility. Good demoof exercises with only minor corrections. Added new exercises with no problems. Progressing well toward stated goals of therapy.    Rehab Potential Good   PT Frequency 2x / week   PT Duration 6 weeks   PT Treatment/Interventions Patient/family education;ADLs/Self Care Home Management;Therapeutic exercise;Therapeutic activities;Neuromuscular re-education;Manual techniques;Dry needling;Cryotherapy;Electrical Stimulation;Moist Heat;Ultrasound   PT Next Visit Plan progress with LE strengthening - standing/step ups/porproception     PT Home Exercise Plan HEP; avoid standing with knees hyperextended    Consulted and Agree with Plan of Care Patient      Patient will benefit from skilled therapeutic intervention in order to improve the following deficits and impairments:  Postural dysfunction, Improper body mechanics, Pain, Decreased range of motion, Decreased mobility, Decreased endurance, Decreased activity tolerance  Visit Diagnosis: Pain in left knee - Plan: PT plan of care cert/re-cert  Pain in right knee - Plan: PT plan of care cert/re-cert  Abnormal posture - Plan: PT plan of care cert/re-cert  Muscle weakness (generalized) - Plan: PT plan of care cert/re-cert     Problem List Patient Active Problem List   Diagnosis Date Noted  . BMI 29.0-29.9,adult 07/28/2015  . HTN (hypertension) 04/14/2012  . Hyperlipidemia 04/14/2012  . AR (allergic rhinitis) 04/14/2012  . GERD (gastroesophageal reflux disease) 04/14/2012  . Osteoarthritis of both knees 04/14/2012  . Vitamin deficiency 04/14/2012    Celyn Rober Minion PT, MPH  09/10/2015, 8:13 AM  [facility] [address] [place], [place], [address] Phone: [phone]   Fax:  [phone]  Name: Valerie Jenkins MRN: [id number] Date of Birth: [birth year]

## 2015-09-24 ENCOUNTER — Encounter: Payer: Self-pay | Admitting: Rehabilitative and Restorative Service Providers"

## 2015-09-24 ENCOUNTER — Ambulatory Visit (INDEPENDENT_AMBULATORY_CARE_PROVIDER_SITE_OTHER): Payer: BLUE CROSS/BLUE SHIELD | Admitting: Rehabilitative and Restorative Service Providers"

## 2015-09-24 DIAGNOSIS — M25562 Pain in left knee: Secondary | ICD-10-CM

## 2015-09-24 DIAGNOSIS — M6281 Muscle weakness (generalized): Secondary | ICD-10-CM

## 2015-09-24 DIAGNOSIS — R293 Abnormal posture: Secondary | ICD-10-CM | POA: Diagnosis not present

## 2015-09-24 DIAGNOSIS — M25561 Pain in right knee: Secondary | ICD-10-CM

## 2015-09-24 NOTE — Therapy (Addendum)
[place], [place], [address] Phone: [phone]   Fax:  [phone]  Physical Therapy Treatment  Patient Details  Name: Valerie Jenkins MRN: [id number] Date of Birth: 01-29-1961 Referring Provider: Dr. Tami Lin   Encounter Date: 09/24/2015      PT End of Session - 09/24/15 0704    Visit Number 3   Number of Visits 6   Date for PT Re-Evaluation 10/08/15   PT Start Time 0704   PT Stop Time 0800   PT Time Calculation (min) 56 min   Activity Tolerance Patient tolerated treatment well      Past Medical History  Diagnosis Date  . Hypertension   . Seasonal allergies   . GERD (gastroesophageal reflux disease)   . Arthritis   . Hyperlipidemia     Past Surgical History  Procedure Laterality Date  . Bartholin cystectomy    . Bartholin gland cyst excision    . Cervical polypectomy  1/84  . Cesarean section  1996    There were no vitals filed for this visit.      Subjective Assessment - 09/24/15 0706    Subjective [name] reports that she is not doing her exercises at home. Knees are sore and aching today. May be the weather. [name] may bwe interested in a HEP instead of coming in for therapy.    Currently in Pain? Yes   Pain Location Knee   Pain Orientation Left   Pain Descriptors / Indicators Sore;Aching   Pain Type Chronic pain   Pain Onset More than a month ago   Pain Frequency Intermittent            [facility] PT Assessment - 09/24/15 0001    Assessment   Medical Diagnosis Arthralgia bilat knees; neck pain    Referring Provider Dr. Tami Lin    Onset Date/Surgical Date 11/01/10   Hand Dominance Right   Next MD Visit no return scheduled   Strength   Overall Strength Comments 5/5 bilat LE's    Flexibility   Hamstrings tight bilat ~ 75-80 deg    Quadriceps heel 7 inch from buttock bilat    ITB tight bilat    Piriformis tight bilat                       [facility] Adult PT Treatment/Exercise - 09/24/15 0001    Knee/Hip Exercises: Stretches   Passive Hamstring Stretch 3 reps;30 seconds   Quad Stretch 3 reps;30 seconds   Hip Flexor Stretch 2 reps;30 seconds   ITB Stretch 2 reps;30 seconds   Piriformis Stretch 2 reps;30 seconds   Knee/Hip Exercises: Aerobic   Nustep L4 x 5 min arms and legs    Knee/Hip Exercises: Standing   Lateral Step Up Both;10 reps   Forward Step Up Both;10 reps   SLS 30 sec x 3    SLS with Vectors fwd touch to chair x 10 each; 3 way leg swing 10 each    Knee/Hip Exercises: Supine   Quad Sets Right;Left;10 reps  10 sec   Bridges with Ball Squeeze Strengthening;Both;10 reps   Straight Leg Raises Right;Left;10 reps   Cryotherapy   Number Minutes Cryotherapy 10 Minutes   Cryotherapy Location Knee  bilat   Type of Cryotherapy Ice pack                PT Education - 09/24/15 6004    Education provided Yes  Education Details HEP   Person(s) Educated Patient   Methods Explanation;Demonstration;Tactile cues;Verbal cues;Handout   Comprehension Verbalized understanding;Returned demonstration;Verbal cues required;Tactile cues required             PT Long Term Goals - 09/24/15 0754    PT LONG TERM GOAL #1   Title Improve LE ROM and tissue extensibility 10/08/15    Time 6   Period Weeks   Status Partially Met   PT LONG TERM GOAL #2   Title Instruct pt in stretchin/strengthening program for HEP 10/08/15   Time 6   Period Weeks   Status Achieved   PT LONG TERM GOAL #3   Title I in HEP 10/08/15   Period Weeks   Status Partially Met   PT LONG TERM GOAL #4   Title Evaluate neck pain as indicated 10/08/15   Time 6   Period Weeks   Status Deferred   PT LONG TERM GOAL #5   Title Improve FOTO to </= 32  % limitation 10/08/15    Time 6   Period Weeks   Status On-going               Plan - 09/24/15 0750    Clinical Impression Statement Patient is not consistent with HEP  . She demonstrates some improvement with flexibility through LE's - specifically the quads and hamstrings. She is doing exercises in the clinic without difficulty. Patient is working to avoid locking knees when standing. She thinks she wants to try exercises on her own at home. We will await her call to reschedule if she decides to schedule additional visits.    Rehab Potential Good   PT Frequency 2x / week   PT Duration 6 weeks   PT Treatment/Interventions Patient/family education;ADLs/Self Care Home Management;Therapeutic exercise;Therapeutic activities;Neuromuscular re-education;Manual techniques;Dry needling;Cryotherapy;Electrical Stimulation;Moist Heat;Ultrasound   PT Next Visit Plan progress with LE strengthening    PT Home Exercise Plan HEP; avoid standing with knees hyperextended    Consulted and Agree with Plan of Care Patient      Patient will benefit from skilled therapeutic intervention in order to improve the following deficits and impairments:  Postural dysfunction, Improper body mechanics, Pain, Decreased range of motion, Decreased mobility, Decreased endurance, Decreased activity tolerance  Visit Diagnosis: Pain in left knee  Pain in right knee  Abnormal posture  Muscle weakness (generalized)     Problem List Patient Active Problem List   Diagnosis Date Noted  . BMI 29.0-29.9,adult 07/28/2015  . HTN (hypertension) 04/14/2012  . Hyperlipidemia 04/14/2012  . AR (allergic rhinitis) 04/14/2012  . GERD (gastroesophageal reflux disease) 04/14/2012  . Osteoarthritis of both knees 04/14/2012  . Vitamin deficiency 04/14/2012    Krishawn Vanderweele Nilda Simmer PT, MPH  09/24/2015, 7:55 AM  [facility] [place], [place], [address] Phone: [phone]   Fax:  [phone]  Name: Valerie Jenkins MRN: [id number] Date of Birth: 10-24-1960    PHYSICAL THERAPY DISCHARGE SUMMARY  Visits from Start of Care: 3  Current  functional level related to goals / functional outcomes: Some improvement but patient continues to have pain in bilat knees. She is interested in HEP instead of coming in for therapy.    Remaining deficits: Decreased ROM and strength; pain continues on an intermittent basis   Education / Equipment: HEP  Plan: Patient agrees to discharge.  Patient goals were not met. Patient is being discharged due to the patient's request.  ?????    Tykel Badie P.  Helene Kelp PT, MPH 11/08/2015 12:03 PM

## 2015-09-24 NOTE — Patient Instructions (Signed)
Forward    Facing step, place one leg on step, flexed at hip. Step up slowly, bringing hips in line with knee and shoulder. Bring other foot onto step. Reverse process to step back down. Repeat with other leg.  Do _10_ repetitions, _1-3___ sets.  Lateral Step Up    Stand to side of step. Step up leading with left leg. Slowly touch heel down with opposite leg. Perform _10__ reps.    Balance: Unilateral    Attempt to balance on left leg, eyes open. Hold _30-60___ seconds. Repeat __3-5__ times per set.  Do __2-3__ sessions per day. Perform exercise with eyes closed.    Balance: Three-Way Leg Swing    Stand on left foot, hands on hips. Reach other foot forward __10__ times, sideways __10__ times, back _10___ times. Hold each position ___2-3_ seconds. Relax. Repeat __1-2__ times per set.  2-3 times/dayl  Balance: Unilateral - Forward Lean    Stand on left foot, hands on hips. Keeping hips level, bend forward as if to touch forehead to wall.  Repeat __10__ times per set. Do __1-3__ sets per session. Do __1-3__ sessions per day.

## 2015-11-13 ENCOUNTER — Encounter: Payer: Self-pay | Admitting: *Deleted

## 2015-11-13 ENCOUNTER — Emergency Department (INDEPENDENT_AMBULATORY_CARE_PROVIDER_SITE_OTHER)
Admission: EM | Admit: 2015-11-13 | Discharge: 2015-11-13 | Disposition: A | Discharge status: Home / Self Care | Payer: BLUE CROSS/BLUE SHIELD | Source: Home / Self Care | Attending: Family Medicine | Admitting: Family Medicine

## 2015-11-13 DIAGNOSIS — J069 Acute upper respiratory infection, unspecified: Secondary | ICD-10-CM | POA: Diagnosis not present

## 2015-11-13 MED ORDER — AZITHROMYCIN 250 MG PO TABS: 250.0000 mg | ORAL_TABLET | Freq: Every day | ORAL | Status: DC

## 2015-11-13 MED ORDER — PREDNISONE 20 MG PO TABS: ORAL_TABLET | ORAL | Status: DC

## 2015-11-13 MED ORDER — GUAIFENESIN 100 MG/5ML PO LIQD: 400.0000 mg | ORAL | Status: DC | PRN

## 2015-11-13 MED ORDER — BENZONATATE 100 MG PO CAPS: 100.0000 mg | ORAL_CAPSULE | Freq: Three times a day (TID) | ORAL | Status: DC

## 2015-11-13 NOTE — Discharge Instructions (Signed)
You may take 400-600mg  Ibuprofen (Motrin) every 6-8 hours for fever and pain  Alternate with Tylenol  You may take 500mg  Tylenol every 4-6 hours as needed for fever and pain  Follow-up with your primary care provider next week for recheck of symptoms if not improving.  Be sure to drink plenty of fluids and rest, at least 8hrs of sleep a night, preferably more while you are sick. Return urgent care or go to closest ER if you cannot keep down fluids/signs of dehydration, fever not reducing with Tylenol, difficulty breathing/wheezing, stiff neck, worsening condition, or other concerns (see below)   Your symptoms are likely due to a virus such as the common cold, however, if you developing worsening chest congestion with shortness of breath, persistent fever for 3 days, or symptoms not improving in 4-5 days, you may fill the antibiotic (azithromycin).  If you do fill the antibiotic,  please take antibiotics as prescribed and be sure to complete entire course even if you start to feel better to ensure infection does not come back.   Cool Mist Vaporizers Vaporizers may help relieve the symptoms of a cough and cold. They add moisture to the air, which helps mucus to become thinner and less sticky. This makes it easier to breathe and cough up secretions. Cool mist vaporizers do not cause serious burns like hot mist vaporizers, which may also be called steamers or humidifiers. Vaporizers have not been proven to help with colds. You should not use a vaporizer if you are allergic to mold. HOME CARE INSTRUCTIONS  Follow the package instructions for the vaporizer.  Do not use anything other than distilled water in the vaporizer.  Do not run the vaporizer all of the time. This can cause mold or bacteria to grow in the vaporizer.  Clean the vaporizer after each time it is used.  Clean and dry the vaporizer well before storing it.  Stop using the vaporizer if worsening respiratory symptoms develop.   This  information is not intended to replace advice given to you by your health care provider. Make sure you discuss any questions you have with your health care provider.   Document Released: 02/14/2004 Document Revised: 05/24/2013 Document Reviewed: 10/06/2012 Elsevier Interactive Patient Education 2016 Else[employer] Inc[employer]  Upper Respiratory Infection, Adult Most upper respiratory infections (URIs) are caused by a virus. A URI affects the nose, throat, and upper air passages. The most common type of URI is often called "the common cold." HOME CARE   Take medicines only as told by your doctor.  Gargle warm saltwater or take cough drops to comfort your throat as told by your doctor.  Use a warm mist humidifier or inhale steam from a shower to increase air moisture. This may make it easier to breathe.  Drink enough fluid to keep your pee (urine) clear or pale yellow.  Eat soups and other clear broths.  Have a healthy diet.  Rest as needed.  Go back to work when your fever is gone or your doctor says it is okay.  You may need to stay home longer to avoid giving your URI to others.  You can also wear a face mask and wash your hands often to prevent spread of the virus.  Use your inhaler more if you have asthma.  Do not use any tobacco products, including cigarettes, chewing tobacco, or electronic cigarettes. If you need help quitting, ask your doctor. GET HELP IF:  You are getting worse, not better.  Your symptoms  are not helped by medicine.  You have chills.  You are getting more short of breath.  You have brown or red mucus.  You have yellow or brown discharge from your nose.  You have pain in your face, especially when you bend forward.  You have a fever.  You have puffy (swollen) neck glands.  You have pain while swallowing.  You have white areas in the back of your throat. GET HELP RIGHT AWAY IF:   You have very bad or constant:  Headache.  Ear pain.  Pain in  your forehead, behind your eyes, and over your cheekbones (sinus pain).  Chest pain.  You have long-lasting (chronic) lung disease and any of the following:  Wheezing.  Long-lasting cough.  Coughing up blood.  A change in your usual mucus.  You have a stiff neck.  You have changes in your:  Vision.  Hearing.  Thinking.  Mood. MAKE SURE YOU:   Understand these instructions.  Will watch your condition.  Will get help right away if you are not doing well or get worse.   This information is not intended to replace advice given to you by your health care provider. Make sure you discuss any questions you have with your health care provider.   Document Released: 11/05/2007 Document Revised: 10/03/2014 Document Reviewed: 08/24/2013 Elsevier Interactive Patient Education 2016 Elsevier Inc.  Cough, Adult A cough helps to clear your throat and lungs. A cough may last only 2-3 weeks (acute), or it may last longer than 8 weeks (chronic). Many different things can cause a cough. A cough may be a sign of an illness or another medical condition. HOME CARE  Pay attention to any changes in your cough.  Take medicines only as told by your doctor.  If you were prescribed an antibiotic medicine, take it as told by your doctor. Do not stop taking it even if you start to feel better.  Talk with your doctor before you try using a cough medicine.  Drink enough fluid to keep your pee (urine) clear or pale yellow.  If the air is dry, use a cold steam vaporizer or humidifier in your home.  Stay away from things that make you cough at work or at home.  If your cough is worse at night, try using extra pillows to raise your head up higher while you sleep.  Do not smoke, and try not to be around smoke. If you need help quitting, ask your doctor.  Do not have caffeine.  Do not drink alcohol.  Rest as needed. GET HELP IF:  You have new problems (symptoms).  You cough up yellow  fluid (pus).  Your cough does not get better after 2-3 weeks, or your cough gets worse.  Medicine does not help your cough and you are not sleeping well.  You have pain that gets worse or pain that is not helped with medicine.  You have a fever.  You are losing weight and you do not know why.  You have night sweats. GET HELP RIGHT AWAY IF:  You cough up blood.  You have trouble breathing.  Your heartbeat is very fast.   This information is not intended to replace advice given to you by your health care provider. Make sure you discuss any questions you have with your health care provider.   Document Released: 01/30/2011 Document Revised: 02/07/2015 Document Reviewed: 07/26/2014 Elsevier Interactive Patient Education [employer]

## 2015-11-13 NOTE — ED Notes (Signed)
Pt c/o 2 days of productive cough, sore throat, congestion and ear fullness. No otc meds taken, afebrile.

## 2015-11-13 NOTE — ED Provider Notes (Signed)
CSN: [id number]     Arrival date & time 11/13/15  1852 History   First MD Initiated Contact with Patient 11/13/15 1854     Chief Complaint  Patient presents with  . Cough  . Sore Throat   (Consider location/radiation/quality/duration/timing/severity/associated sxs/prior Treatment) HPI  Valerie Jenkins is a 55 y.o. female presenting to UC with c/o 2 days of moderately productive cough with associated sore throat, congestion, and bilateral ear fullness. She has tried cough drops but no other over the counter medications.  Throat pain is aching and sore, worse with swallowing. No sick contacts. She does note this weekend she was out of town and felt like the air conditioner was blowing cold air on her all night, she woke with congestion the next morning. Denies fever, chills, n/v/d.    Past Medical History  Diagnosis Date  . Hypertension   . Seasonal allergies   . GERD (gastroesophageal reflux disease)   . Arthritis   . Hyperlipidemia    Past Surgical History  Procedure Laterality Date  . Bartholin cystectomy    . Bartholin gland cyst excision    . Cervical polypectomy  1/84  . Cesarean section  1996   Family History  Problem Relation Age of Onset  . Diabetes Mother   . Hypertension Mother   . Hyperlipidemia Mother   . Osteoporosis Mother   . Diabetes Father   . Hypertension Father   . Hyperlipidemia Father   . Stroke Father   . Hypertension Sister   . Arthritis Paternal Grandmother    Social History  Substance Use Topics  . Smoking status: Never Smoker   . Smokeless tobacco: None  . Alcohol Use: 0.6 oz/week    1 Standard drinks or equivalent per week   OB History    Gravida Para Term Preterm AB TAB SAB Ectopic Multiple Living   1 1 1       1      Review of Systems  Constitutional: Negative for fever and chills.  HENT: Positive for congestion, ear pain ( bilateral ear fullness) and sore throat. Negative for rhinorrhea, sinus pressure, trouble swallowing and voice  change.   Respiratory: Positive for cough. Negative for shortness of breath.   Cardiovascular: Negative for chest pain and palpitations.  Gastrointestinal: Negative for nausea, vomiting, abdominal pain and diarrhea.  Musculoskeletal: Negative for myalgias, back pain and arthralgias.  Skin: Negative for rash.    Allergies  Flexeril; Peanut-containing drug products; and Shellfish allergy  Home Medications   Prior to Admission medications   Medication Sig Start Date End Date Taking? Authorizing Provider  atorvastatin (LIPITOR) 10 MG tablet Take 1 tablet (10 mg total) by mouth daily. 07/28/15  Yes Tonye Pearsonobert P Doolittle, MD  losartan-hydrochlorothiazide (HYZAAR) 50-12.5 MG tablet Take 1 tablet by mouth daily. 07/28/15  Yes Tonye Pearsonobert P Doolittle, MD  montelukast (SINGULAIR) 10 MG tablet TAKE ONE TABLET BY MOUTH NIGHTLY AT BEDTIME 07/28/15  Yes Tonye Pearsonobert P Doolittle, MD  omeprazole (PRILOSEC) 40 MG capsule Take 1 capsule (40 mg total) by mouth daily. 07/28/15  Yes Tonye Pearsonobert P Doolittle, MD  azelastine (OPTIVAR) 0.05 % ophthalmic solution Place 1 drop into both eyes 2 (two) times daily. 07/28/15   Tonye Pearsonobert P Doolittle, MD  azithromycin (ZITHROMAX) 250 MG tablet Take 1 tablet (250 mg total) by mouth daily. Take first 2 tablets together, then 1 every day until finished. 11/13/15   Junius FinnerErin O'Malley, PA-C  benzonatate (TESSALON) 100 MG capsule Take 1-2 capsules (100-200 mg total) by mouth  every 8 (eight) hours. 11/13/15   Junius Finner, PA-C  Calcium Carb-Cholecalciferol 600-800 MG-UNIT TABS Take 2 capsules by mouth daily. 07/28/15   Tonye Pearson, MD  clonazePAM (KLONOPIN) 0.5 MG tablet Take 1 tablet (0.5 mg total) by mouth at bedtime. 05/03/14   Tonye Pearson, MD  EPINEPHrine (EPIPEN 2-PAK) 0.3 mg/0.3 mL IJ SOAJ injection INJECT 0.3 ML (0.3 MG TOTAL) INTRAMUSCULARLY ONCE, IF YOU USE THE DEVICE, CALL 911 AND TO THE EMERGENCY ROOM 07/28/15   Tonye Pearson, MD  fluticasone [facility]) 50 MCG/ACT nasal spray USE 2  SPRAYS IN [facility] NOSTRIL DAILY 07/28/15   Tonye Pearson, MD  guaiFENesin (ROBITUSSIN) 100 MG/5ML liquid Take 20 mLs (400 mg total) by mouth every 4 (four) hours as needed for cough or congestion. 11/13/15   Junius Finner, PA-C  predniSONE (DELTASONE) 20 MG tablet 3 tabs po day one, then 2 po daily x 4 days 11/13/15   Junius Finner, PA-C   Meds Ordered and Administered this Visit  Medications - No data to display  BP 134/84 mmHg  Pulse 79  Temp(Src) 98.1 F (36.7 C) (Oral)  Resp 14  Wt 171 lb (77.565 kg)  SpO2 98%  LMP 07/15/2015 (Within Months) No data found.   Physical Exam  Constitutional: She appears well-developed and well-nourished. No distress.  HENT:  Head: Normocephalic and atraumatic.  Right Ear: Tympanic membrane normal.  Left Ear: No tenderness. Tympanic membrane is erythematous. Tympanic membrane is not injected and not bulging.  No middle ear effusion.  Nose: Nose normal. Right sinus exhibits no maxillary sinus tenderness and no frontal sinus tenderness. Left sinus exhibits no maxillary sinus tenderness and no frontal sinus tenderness.  Mouth/Throat: Uvula is midline and mucous membranes are normal. Posterior oropharyngeal erythema present. No oropharyngeal exudate, posterior oropharyngeal edema or tonsillar abscesses.  Eyes: Conjunctivae are normal. No scleral icterus.  Neck: Normal range of motion. Neck supple.  Cardiovascular: Normal rate, regular rhythm and normal heart sounds.   Pulmonary/Chest: Effort normal and breath sounds normal. No respiratory distress. She has no wheezes. She has no rales.  Coarse intermittent cough on exam. No respiratory distress. Lungs: CTAB  Abdominal: Soft. She exhibits no distension. There is no tenderness.  Musculoskeletal: Normal range of motion.  Neurological: She is alert.  Skin: Skin is warm and dry. She is not diaphoretic.  Nursing note and vitals reviewed.   ED Course  Procedures (including critical care time)  Labs  Review Labs Reviewed - No data to display  Imaging Review No results found.    MDM   1. Acute upper respiratory infection    Symptoms likely viral in nature. Encouraged symptomatic treatment.  Rx: prednisone, tessalon, and robitussin.  Prescription to hold for azithromycin with expiration date provided. Advised to fill in 4-5 days if symptoms continue to worsen, sooner if persistent fever.   F/u with PCP in 7-10 days if not improving. Patient verbalized understanding and agreement with treatment plan.     Junius Finner, PA-C 11/13/15 1920

## 2016-01-10 ENCOUNTER — Other Ambulatory Visit: Payer: Self-pay | Admitting: Obstetrics & Gynecology

## 2016-01-10 DIAGNOSIS — Z1231 Encounter for screening mammogram for malignant neoplasm of breast: Secondary | ICD-10-CM

## 2016-02-11 ENCOUNTER — Ambulatory Visit: Payer: BLUE CROSS/BLUE SHIELD

## 2016-03-25 DIAGNOSIS — J301 Allergic rhinitis due to pollen: Secondary | ICD-10-CM | POA: Insufficient documentation

## 2016-05-19 ENCOUNTER — Other Ambulatory Visit: Payer: Self-pay | Admitting: Physician Assistant

## 2016-05-19 ENCOUNTER — Ambulatory Visit
Admission: RE | Admit: 2016-05-19 | Discharge: 2016-05-19 | Disposition: A | Discharge status: Home / Self Care | Payer: BLUE CROSS/BLUE SHIELD | Source: Ambulatory Visit | Attending: Obstetrics & Gynecology | Admitting: Obstetrics & Gynecology

## 2016-05-19 DIAGNOSIS — Z1231 Encounter for screening mammogram for malignant neoplasm of breast: Secondary | ICD-10-CM

## 2016-05-20 ENCOUNTER — Encounter: Payer: BLUE CROSS/BLUE SHIELD | Admitting: Certified Nurse Midwife

## 2016-06-09 DIAGNOSIS — R51 Headache: Secondary | ICD-10-CM | POA: Diagnosis not present

## 2016-06-09 DIAGNOSIS — I10 Essential (primary) hypertension: Secondary | ICD-10-CM | POA: Diagnosis not present

## 2016-06-09 DIAGNOSIS — R5383 Other fatigue: Secondary | ICD-10-CM | POA: Diagnosis not present

## 2016-06-09 DIAGNOSIS — R0683 Snoring: Secondary | ICD-10-CM | POA: Diagnosis not present

## 2016-06-30 DIAGNOSIS — D259 Leiomyoma of uterus, unspecified: Secondary | ICD-10-CM | POA: Diagnosis not present

## 2016-07-24 ENCOUNTER — Telehealth: Payer: Self-pay

## 2016-07-24 NOTE — Telephone Encounter (Signed)
Spoke with pharmacy. No request received. Also, patient now receives primary care elsewhere.

## 2016-07-24 NOTE — Telephone Encounter (Signed)
[facility] pharmacy is calling to check the status of two prescriptions (artovastatin, montelukast).  They states that they put in two requests and haven't heard back yet.  Please advise  [phone]

## 2016-07-25 DIAGNOSIS — R232 Flushing: Secondary | ICD-10-CM | POA: Diagnosis not present

## 2016-07-25 DIAGNOSIS — Z79899 Other long term (current) drug therapy: Secondary | ICD-10-CM | POA: Diagnosis not present

## 2016-07-25 DIAGNOSIS — N951 Menopausal and female climacteric states: Secondary | ICD-10-CM | POA: Diagnosis not present

## 2016-07-25 DIAGNOSIS — Z7982 Long term (current) use of aspirin: Secondary | ICD-10-CM | POA: Diagnosis not present

## 2016-07-25 DIAGNOSIS — E7800 Pure hypercholesterolemia, unspecified: Secondary | ICD-10-CM | POA: Diagnosis not present

## 2016-07-25 DIAGNOSIS — I10 Essential (primary) hypertension: Secondary | ICD-10-CM | POA: Diagnosis not present

## 2016-07-25 DIAGNOSIS — N926 Irregular menstruation, unspecified: Secondary | ICD-10-CM | POA: Diagnosis not present

## 2016-07-25 DIAGNOSIS — Z888 Allergy status to other drugs, medicaments and biological substances status: Secondary | ICD-10-CM | POA: Diagnosis not present

## 2017-02-07 DIAGNOSIS — I10 Essential (primary) hypertension: Secondary | ICD-10-CM | POA: Diagnosis not present

## 2017-04-08 NOTE — Telephone Encounter (Signed)
done

## 2017-05-19 ENCOUNTER — Other Ambulatory Visit: Payer: Self-pay | Admitting: Physician Assistant

## 2017-05-19 DIAGNOSIS — Z139 Encounter for screening, unspecified: Secondary | ICD-10-CM

## 2017-06-18 ENCOUNTER — Ambulatory Visit: Payer: BLUE CROSS/BLUE SHIELD

## 2017-06-29 ENCOUNTER — Ambulatory Visit
Admission: RE | Admit: 2017-06-29 | Discharge: 2017-06-29 | Disposition: A | Discharge status: Home / Self Care | Payer: BLUE CROSS/BLUE SHIELD | Source: Ambulatory Visit | Attending: Physician Assistant | Admitting: Physician Assistant

## 2017-06-29 DIAGNOSIS — Z139 Encounter for screening, unspecified: Secondary | ICD-10-CM

## 2017-07-31 ENCOUNTER — Emergency Department (INDEPENDENT_AMBULATORY_CARE_PROVIDER_SITE_OTHER)
Admission: EM | Admit: 2017-07-31 | Discharge: 2017-07-31 | Disposition: A | Discharge status: Home / Self Care | Payer: BLUE CROSS/BLUE SHIELD | Source: Home / Self Care | Attending: Family Medicine | Admitting: Family Medicine

## 2017-07-31 ENCOUNTER — Other Ambulatory Visit: Payer: Self-pay

## 2017-07-31 DIAGNOSIS — J069 Acute upper respiratory infection, unspecified: Secondary | ICD-10-CM

## 2017-07-31 DIAGNOSIS — B9789 Other viral agents as the cause of diseases classified elsewhere: Secondary | ICD-10-CM

## 2017-07-31 MED ORDER — BENZONATATE 200 MG PO CAPS: ORAL_CAPSULE | ORAL | 0 refills | Status: DC

## 2017-07-31 MED ORDER — PREDNISONE 20 MG PO TABS: ORAL_TABLET | ORAL | 0 refills | Status: DC

## 2017-07-31 MED ORDER — AZITHROMYCIN 250 MG PO TABS: ORAL_TABLET | ORAL | 0 refills | Status: DC

## 2017-07-31 NOTE — Discharge Instructions (Signed)
Take plain guaifenesin (1200mg  extended release tabs such as Mucinex) twice daily, with plenty of water, for cough and congestion.  Get adequate rest.   May use Afrin nasal spray (or generic oxymetazoline) each morning for about 5 days and then discontinue.  Also recommend using saline nasal spray several times daily and saline nasal irrigation (AYR is a common brand).  Use Flonase nasal spray each morning after using Afrin nasal spray and saline nasal irrigation. Try warm salt water gargles for sore throat.  Stop all antihistamines for now, and other non-prescription cough/cold preparations. May continue Singulair. May take Delsym Cough Suppressant with Tessalon at bedtime for nighttime cough.  Begin Azithromycin if not improving about one week or if persistent fever develops

## 2017-07-31 NOTE — ED Triage Notes (Signed)
Started Wednesday with a scratchy throat, nasal drainage and cough.

## 2017-07-31 NOTE — ED Provider Notes (Signed)
[facility]    CSN: [id number] Arrival date & time: 07/31/17  1739     History   Chief Complaint Chief Complaint  Patient presents with  . Cough  . Sore Throat    HPI Valerie Jenkins is a 57 y.o. female.   Patient complains of three day history of typical cold-like symptoms developing over several days, including mild sore throat, sinus congestion, chills, fatigue, and cough.    The history is provided by the patient.    Past Medical History:  Diagnosis Date  . Arthritis   . GERD (gastroesophageal reflux disease)   . Hyperlipidemia   . Hypertension   . Seasonal allergies     Patient Active Problem List   Diagnosis Date Noted  . BMI 29.0-29.9,adult 07/28/2015  . HTN (hypertension) 04/14/2012  . Hyperlipidemia 04/14/2012  . AR (allergic rhinitis) 04/14/2012  . GERD (gastroesophageal reflux disease) 04/14/2012  . Osteoarthritis of both knees 04/14/2012  . Vitamin deficiency 04/14/2012    Past Surgical History:  Procedure Laterality Date  . bartholin cystectomy    . BARTHOLIN GLAND CYST EXCISION    . CERVICAL POLYPECTOMY  1/84  . CESAREAN SECTION  1996    OB History    Gravida Para Term Preterm AB Living   1 1 1     1    SAB TAB Ectopic Multiple Live Births           1       Home Medications    Prior to Admission medications   Medication Sig Start Date End Date Taking? Authorizing Provider  atorvastatin (LIPITOR) 10 MG tablet Take 1 tablet (10 mg total) by mouth daily. 07/28/15   Tonye Pearsonoolittle, Robert P, MD  azelastine (OPTIVAR) 0.05 % ophthalmic solution Place 1 drop into both eyes 2 (two) times daily. 07/28/15   Tonye Pearsonoolittle, Robert P, MD  azithromycin (ZITHROMAX Z-PAK) 250 MG tablet Take 2 tabs today; then begin one tab once daily for 4 more days. (Rx void after 08/08/17) 07/31/17   Lattie HawBeese, Stephen A, MD  benzonatate (TESSALON) 200 MG capsule Take one cap by mouth at bedtime as needed for cough.  May repeat in 4 to 6 hours 07/31/17   Lattie HawBeese, Stephen A, MD   Calcium Carb-Cholecalciferol 600-800 MG-UNIT TABS Take 2 capsules by mouth daily. 07/28/15   Tonye Pearsonoolittle, Robert P, MD  clonazePAM (KLONOPIN) 0.5 MG tablet Take 1 tablet (0.5 mg total) by mouth at bedtime. 05/03/14   Tonye Pearsonoolittle, Robert P, MD  EPINEPHrine (EPIPEN 2-PAK) 0.3 mg/0.3 mL IJ SOAJ injection INJECT 0.3 ML (0.3 MG TOTAL) INTRAMUSCULARLY ONCE, IF YOU USE THE DEVICE, CALL 911 AND TO THE EMERGENCY ROOM 07/28/15   Tonye Pearsonoolittle, Robert P, MD  fluticasone [facility](FLONASE) 50 MCG/ACT [facility] 07/28/15   Tonye Pearsonoolittle, Robert P, MD  guaiFENesin (ROBITUSSIN) 100 MG/5ML liquid Take 20 mLs (400 mg total) by mouth every 4 (four) hours as needed for cough or congestion. 11/13/15   Lurene ShadowPhelps, Erin O, PA-C  losartan-hydrochlorothiazide (HYZAAR) 50-12.5 MG tablet Take 1 tablet by mouth daily. 07/28/15   Tonye Pearsonoolittle, Robert P, MD  montelukast (SINGULAIR) 10 MG tablet TAKE ONE TABLET BY MOUTH NIGHTLY AT BEDTIME 07/28/15   Tonye Pearsonoolittle, Robert P, MD  omeprazole (PRILOSEC) 40 MG capsule Take 1 capsule (40 mg total) by mouth daily. 07/28/15   Tonye Pearsonoolittle, Robert P, MD  predniSONE (DELTASONE) 20 MG tablet Take one tab by mouth twice daily for 4 days, then one daily. Take with food.  07/31/17   Lattie Haw, MD    Family History Family History  Problem Relation Age of Onset  . Diabetes Mother   . Hypertension Mother   . Hyperlipidemia Mother   . Osteoporosis Mother   . Diabetes Father   . Hypertension Father   . Hyperlipidemia Father   . Stroke Father   . Hypertension Sister   . Arthritis Paternal Grandmother     Social History Social History   Tobacco Use  . Smoking status: Never Smoker  Substance Use Topics  . Alcohol use: Yes    Alcohol/week: 0.6 oz    Types: 1 Standard drinks or equivalent per week  . Drug use: No     Allergies   Flexeril [cyclobenzaprine hcl]; Peanut-containing drug products; and Shellfish allergy   Review of Systems Review of Systems + sore throat +  cough No pleuritic pain No wheezing + nasal congestion + post-nasal drainage No sinus pain/pressure No itchy/red eyes No earache No hemoptysis No SOB No fever, + chills No nausea No vomiting No abdominal pain No diarrhea No urinary symptoms No skin rash + fatigue + myalgias No headache Used OTC meds without relief   Physical Exam Triage Vital Signs ED Triage Vitals  Enc Vitals Group     BP 07/31/17 1754 133/85     Pulse Rate 07/31/17 1754 61     Resp --      Temp 07/31/17 1754 97.9 F (36.6 C)     Temp Source 07/31/17 1754 Oral     SpO2 07/31/17 1754 98 %     Weight 07/31/17 1755 166 lb (75.3 kg)     Height 07/31/17 1755 5\' 5"  (1.651 m)     Head Circumference --      Peak Flow --      Pain Score 07/31/17 1755 0     Pain Loc --      Pain Edu? --      Excl. in GC? --    No data found.  Updated Vital Signs BP 133/85 (BP Location: Right Arm)   Pulse 61   Temp 97.9 F (36.6 C) (Oral)   Ht 5\' 5"  (1.651 m)   Wt 166 lb (75.3 kg)   LMP 04/16/2016   SpO2 98%   BMI 27.62 kg/m   Visual Acuity Right Eye Distance:   Left Eye Distance:   Bilateral Distance:    Right Eye Near:   Left Eye Near:    Bilateral Near:     Physical Exam Nursing notes and Vital Signs reviewed. Appearance:  Patient appears stated age, and in no acute distress Eyes:  Pupils are equal, round, and reactive to light and accomodation.  Extraocular movement is intact.  Conjunctivae are not inflamed  Ears:  Canals normal.  Tympanic membranes normal.  Nose:  Mildly congested turbinates.  No sinus tenderness.  Pharynx:  Normal Neck:  Supple.  Enlarged posterior/lateral nodes are palpated bilaterally, tender to palpation on the left.   Lungs:  Clear to auscultation.  Breath sounds are equal.  Moving air well. Heart:  Regular rate and rhythm without murmurs, rubs, or gallops.  Abdomen:  Nontender without masses or hepatosplenomegaly.  Bowel sounds are present.  No CVA or flank tenderness.   Extremities:  No edema.  Skin:  No rash present.    UC Treatments / Results  Labs (all labs ordered are listed, but only abnormal results are displayed) Labs Reviewed - No data to display  EKG  EKG  Interpretation None       Radiology No results found.  Procedures Procedures (including critical care time)  Medications Ordered in UC Medications - No data to display   Initial Impression / Assessment and Plan / UC Course  I have reviewed the triage vital signs and the nursing notes.  Pertinent labs & imaging results that were available during my care of the patient were reviewed by me and considered in my medical decision making (see chart for details).    There is no evidence of bacterial infection today.   Begin prednisone burst/taper. Prescription written for Benzonatate [facility]) to take at bedtime for night-time cough.  Take plain guaifenesin (1200mg  extended release tabs such as Mucinex) twice daily, with plenty of water, for cough and congestion.  Get adequate rest.   May use Afrin nasal spray (or generic oxymetazoline) each morning for about 5 days and then discontinue.  Also recommend using saline nasal spray several times daily and saline nasal irrigation (AYR is a common brand).  Use Flonase nasal spray each morning after using Afrin nasal spray and saline nasal irrigation. Try warm salt water gargles for sore throat.  Stop all antihistamines for now, and other non-prescription cough/cold preparations. May continue Singulair. May take Delsym Cough Suppressant with Tessalon at bedtime for nighttime cough.  Begin Azithromycin if not improving about one week or if persistent fever develops (Given a prescription to hold, with an expiration date)  Followup with Family Doctor if not improved in about 10 days.    Final Clinical Impressions(s) / UC Diagnoses   Final diagnoses:  Viral URI with cough    ED Discharge Orders        Ordered    benzonatate (TESSALON)  200 MG capsule     07/31/17 1824    predniSONE (DELTASONE) 20 MG tablet     07/31/17 1824    azithromycin (ZITHROMAX Z-PAK) 250 MG tablet     07/31/17 1826          Lattie Haw, MD 08/07/17 1920

## 2017-08-03 ENCOUNTER — Ambulatory Visit
Admission: RE | Admit: 2017-08-03 | Discharge: 2017-08-03 | Disposition: A | Discharge status: Home / Self Care | Payer: BLUE CROSS/BLUE SHIELD | Source: Ambulatory Visit | Attending: Physician Assistant | Admitting: Physician Assistant

## 2017-08-03 DIAGNOSIS — Z1231 Encounter for screening mammogram for malignant neoplasm of breast: Secondary | ICD-10-CM | POA: Diagnosis not present

## 2017-10-20 DIAGNOSIS — Z01419 Encounter for gynecological examination (general) (routine) without abnormal findings: Secondary | ICD-10-CM | POA: Diagnosis not present

## 2017-10-20 DIAGNOSIS — K5900 Constipation, unspecified: Secondary | ICD-10-CM | POA: Diagnosis not present

## 2017-10-20 DIAGNOSIS — N760 Acute vaginitis: Secondary | ICD-10-CM | POA: Diagnosis not present

## 2017-10-20 DIAGNOSIS — R232 Flushing: Secondary | ICD-10-CM | POA: Diagnosis not present

## 2017-10-20 DIAGNOSIS — Z888 Allergy status to other drugs, medicaments and biological substances status: Secondary | ICD-10-CM | POA: Diagnosis not present

## 2018-04-22 ENCOUNTER — Ambulatory Visit (INDEPENDENT_AMBULATORY_CARE_PROVIDER_SITE_OTHER): Payer: BLUE CROSS/BLUE SHIELD | Admitting: Internal Medicine

## 2018-04-22 ENCOUNTER — Encounter: Payer: Self-pay | Admitting: Internal Medicine

## 2018-04-22 VITALS — BP 106/70 | HR 68 | Temp 98.2°F | Ht 65.0 in | Wt 167.8 lb

## 2018-04-22 DIAGNOSIS — M25562 Pain in left knee: Secondary | ICD-10-CM

## 2018-04-22 DIAGNOSIS — M25511 Pain in right shoulder: Secondary | ICD-10-CM

## 2018-04-22 DIAGNOSIS — E7800 Pure hypercholesterolemia, unspecified: Secondary | ICD-10-CM

## 2018-04-22 DIAGNOSIS — I10 Essential (primary) hypertension: Secondary | ICD-10-CM

## 2018-04-22 DIAGNOSIS — R202 Paresthesia of skin: Secondary | ICD-10-CM | POA: Diagnosis not present

## 2018-04-22 DIAGNOSIS — M25561 Pain in right knee: Secondary | ICD-10-CM

## 2018-04-22 MED ORDER — LOSARTAN POTASSIUM 100 MG PO TABS: 100.0000 mg | ORAL_TABLET | Freq: Every day | ORAL | 0 refills | Status: DC

## 2018-04-22 MED ORDER — FLUTICASONE PROPIONATE 50 MCG/ACT NA SUSP: NASAL | 11 refills | Status: AC

## 2018-04-22 MED ORDER — EPINEPHRINE 0.3 MG/0.3ML IJ SOAJ: INTRAMUSCULAR | 11 refills | Status: DC

## 2018-04-22 MED ORDER — HYDROCHLOROTHIAZIDE 12.5 MG PO TABS: 12.5000 mg | ORAL_TABLET | Freq: Every day | ORAL | 0 refills | Status: DC

## 2018-04-22 NOTE — Progress Notes (Signed)
Subjective:     Patient ID: Valerie Jenkins , female    DOB: [birth year] , 57 y.o.   MRN: [id number]   Chief Complaint  Patient presents with  . New Patient (Initial Visit)    HPI Pt is here to establish care with our practice. Her prior PCP died.  Had GYN Oct 24, 2017 and had some labs then. BP at home 110-130/70'80's.  Has hx of HTN x age 38 y Hx of Hyperlipidemia x 76 y.   She started just working on avoiding some inflammatory foods.   Past Medical History:  Diagnosis Date  . Arthritis   . GERD (gastroesophageal reflux disease)   . Hyperlipidemia   . Hypertension   . Seasonal allergies      Family History  Problem Relation Age of Onset  . Diabetes Mother   . Hypertension Mother   . Hyperlipidemia Mother   . Osteoporosis Mother   . Diabetes Father   . Hypertension Father   . Hyperlipidemia Father   . Stroke Father   . Hypertension Sister   . Arthritis Paternal Grandmother      Current Outpatient Medications:  .  aspirin 81 MG chewable tablet, Chew by mouth., Disp: , Rfl:  .  atorvastatin (LIPITOR) 10 MG tablet, Take 1 tablet (10 mg total) by mouth daily., Disp: 90 tablet, Rfl: 3 .  azelastine (OPTIVAR) 0.05 % ophthalmic solution, Place 1 drop into both eyes 2 (two) times daily., Disp: 6 mL, Rfl: 11 .  EPINEPHrine (EPIPEN 2-PAK) 0.3 mg/0.3 mL IJ SOAJ injection, INJECT 0.3 ML (0.3 MG TOTAL) INTRAMUSCULARLY ONCE, IF YOU USE THE DEVICE, CALL 911 AND TO THE EMERGENCY ROOM, Disp: 1 Device, Rfl: 11 .  fluticasone (FLONASE) 50 MCG/ACT nasal spray, USE 2 SPRAYS IN EACH NOSTRIL DAILY, Disp: 16 g, Rfl: 11 .  losartan-hydrochlorothiazide (HYZAAR) 50-12.5 MG tablet, Take 1 tablet by mouth daily., Disp: 90 tablet, Rfl: 3 .  montelukast (SINGULAIR) 10 MG tablet, TAKE ONE TABLET BY MOUTH NIGHTLY AT BEDTIME, Disp: 90 tablet, Rfl: 3 .  omeprazole (PRILOSEC) 40 MG capsule, Take 1 capsule (40 mg total) by mouth daily., Disp: 90 capsule, Rfl: 3   Allergies  Allergen Reactions  .  Cyclobenzaprine Swelling  . Flexeril [Cyclobenzaprine Hcl]   . Peanut Oil Swelling  . Peanut-Containing Drug Products   . Shellfish Allergy      Review of Systems  Constitutional: Negative for diaphoresis.  HENT: Negative for tinnitus.   Respiratory: Negative for shortness of breath.   Cardiovascular: Negative for chest pain, palpitations and leg swelling.  Gastrointestinal: Positive for constipation.  Musculoskeletal: Positive for arthralgias.       Of both knees and last month had R lateral hip pain which is better now.   Neurological: Positive for numbness. Negative for weakness.       Off and on of hands and feet any time. Sometimes gets it on her LE's  Hematological: Negative for adenopathy.     Today's Vitals   04/22/18 1444  BP: 106/70  Pulse: 68  Temp: 98.2 F (36.8 C)  TempSrc: Oral  SpO2: 98%  Weight: 167 lb 12.8 oz (76.1 kg)  Height: 5\' 5"  (1.651 m)   Body mass index is 27.92 kg/m.   Objective:  Physical Exam   Constitutional: She is oriented to person, place, and time. She appears well-developed and well-nourished. No distress.  HENT:  Head: Normocephalic and atraumatic.  Right Ear: External ear normal.  Left Ear: External ear normal.  Nose:  Nose normal.  Eyes: Conjunctivae are normal. Right eye exhibits no discharge. Left eye exhibits no discharge. No scleral icterus.  Neck: Neck supple. No thyromegaly present.  No carotid bruits bilaterally  Cardiovascular: Normal rate and regular rhythm.  No murmur heard. Pulmonary/Chest: Effort normal and breath sounds normal. No respiratory distress.  Musculoskeletal: Normal range of motion. She exhibits no edema. No local tenderness on R trochanter region. L knee looks a little larger than the R.  Lymphadenopathy:    She has no cervical adenopathy.  Neurological: She is alert and oriented to person, place, and time.  Skin: Skin is warm and dry. Capillary refill takes less than 2 seconds. No rash noted. She is not  diaphoretic.  Psychiatric: She has a normal mood and affect. Her behavior is normal. Judgment and thought content normal.  Nursing note reviewed. Assessment And Plan:    1. Essential hypertension- stable. May continue same meds - CBC no Diff - CMP14 + Anion Gap  2. Pure hypercholesterolemia- chronic. May continue same meds - Lipid Profile - TSH - T3, free - T4, Free  3. Paresthesia- acute. Will make apt for work up of this.  - Vitamin B12  4. Arthralgia of both knees- chronic. We will see her specifically for this in more detail in the future.   5. Arthralgia of shoulder region, right- chronic. We will see her specifically for this in more detail in the future.    FU for physical and complaints she wants to address in more detail.    Sparrow [place] RODRIGUEZ-SOUTHWORTH, PA-C

## 2018-04-22 NOTE — Patient Instructions (Addendum)
  Check on this naturopath web site to learn more about.   www.draxe.com   Try C.A.L.M. A natural magnesium powder. Follow the instructions and titrate it gradually.

## 2018-04-23 ENCOUNTER — Other Ambulatory Visit: Payer: Self-pay | Admitting: Internal Medicine

## 2018-04-23 LAB — CBC
HEMATOCRIT: 40.5 % (ref 34.0–46.6)
Hemoglobin: 13.7 g/dL (ref 11.1–15.9)
MCH: 27.7 pg (ref 26.6–33.0)
MCHC: 33.8 g/dL (ref 31.5–35.7)
MCV: 82 fL (ref 79–97)
Platelets: 325 10*3/uL (ref 150–450)
RBC: 4.94 x10E6/uL (ref 3.77–5.28)
RDW: 14.1 % (ref 12.3–15.4)
WBC: 8.1 10*3/uL (ref 3.4–10.8)

## 2018-04-23 LAB — LIPID PANEL
Chol/HDL Ratio: 3.4 ratio (ref 0.0–4.4)
Cholesterol, Total: 196 mg/dL (ref 100–199)
HDL: 58 mg/dL (ref >39)
LDL Calculated: 112 mg/dL — ABNORMAL HIGH (ref 0–99)
Triglycerides: 130 mg/dL (ref 0–149)
VLDL Cholesterol Cal: 26 mg/dL (ref 5–40)

## 2018-04-23 LAB — CMP14 + ANION GAP
ALBUMIN: 4.5 g/dL (ref 3.5–5.5)
ALK PHOS: 102 IU/L (ref 39–117)
ALT: 14 IU/L (ref 0–32)
ANION GAP: 17 mmol/L (ref 10.0–18.0)
AST: 14 IU/L (ref 0–40)
Albumin/Globulin Ratio: 1.8 (ref 1.2–2.2)
BUN / CREAT RATIO: 20 (ref 9–23)
BUN: 17 mg/dL (ref 6–24)
Bilirubin Total: 0.3 mg/dL (ref 0.0–1.2)
CHLORIDE: 97 mmol/L (ref 96–106)
CO2: 26 mmol/L (ref 20–29)
Calcium: 10.1 mg/dL (ref 8.7–10.2)
Creatinine, Ser: 0.87 mg/dL (ref 0.57–1.00)
GFR calc Af Amer: 84 mL/min/{1.73_m2} (ref >59)
GFR calc non Af Amer: 73 mL/min/{1.73_m2} (ref >59)
GLOBULIN, TOTAL: 2.5 g/dL (ref 1.5–4.5)
Glucose: 78 mg/dL (ref 65–99)
Potassium: 3.7 mmol/L (ref 3.5–5.2)
SODIUM: 140 mmol/L (ref 134–144)
Total Protein: 7 g/dL (ref 6.0–8.5)

## 2018-04-23 LAB — TSH: TSH: 1.53 u[IU]/mL (ref 0.450–4.500)

## 2018-04-23 LAB — T3, FREE: T3 FREE: 3.2 pg/mL (ref 2.0–4.4)

## 2018-04-23 LAB — T4, FREE: Free T4: 1.43 ng/dL (ref 0.82–1.77)

## 2018-04-23 LAB — VITAMIN B12: VITAMIN B 12: 1304 pg/mL — AB (ref 232–1245)

## 2018-04-26 NOTE — Telephone Encounter (Signed)
Pharmacy requesting an alternative

## 2018-04-27 ENCOUNTER — Other Ambulatory Visit: Payer: Self-pay

## 2018-04-27 ENCOUNTER — Encounter: Payer: Self-pay | Admitting: Internal Medicine

## 2018-04-27 ENCOUNTER — Other Ambulatory Visit: Payer: Self-pay | Admitting: Internal Medicine

## 2018-04-27 MED ORDER — EPINEPHRINE 0.15 MG/0.3ML IJ SOAJ: 0.1500 mg | INTRAMUSCULAR | 0 refills | Status: AC | PRN

## 2018-04-27 NOTE — Telephone Encounter (Signed)
Rx printed by error. It will be faxed  By MA To [facility] [place].

## 2018-05-13 ENCOUNTER — Encounter: Payer: Self-pay | Admitting: Internal Medicine

## 2018-05-13 ENCOUNTER — Ambulatory Visit (INDEPENDENT_AMBULATORY_CARE_PROVIDER_SITE_OTHER): Payer: BLUE CROSS/BLUE SHIELD | Admitting: Internal Medicine

## 2018-05-13 VITALS — BP 120/70 | HR 66 | Temp 97.9°F | Ht 65.0 in | Wt 166.8 lb

## 2018-05-13 DIAGNOSIS — Z0000 Encounter for general adult medical examination without abnormal findings: Secondary | ICD-10-CM | POA: Diagnosis not present

## 2018-05-13 DIAGNOSIS — G8929 Other chronic pain: Secondary | ICD-10-CM

## 2018-05-13 DIAGNOSIS — M25562 Pain in left knee: Secondary | ICD-10-CM | POA: Diagnosis not present

## 2018-05-13 DIAGNOSIS — Z0001 Encounter for general adult medical examination with abnormal findings: Secondary | ICD-10-CM

## 2018-05-13 DIAGNOSIS — Z1212 Encounter for screening for malignant neoplasm of rectum: Secondary | ICD-10-CM | POA: Diagnosis not present

## 2018-05-13 DIAGNOSIS — I10 Essential (primary) hypertension: Secondary | ICD-10-CM | POA: Diagnosis not present

## 2018-05-13 DIAGNOSIS — M25561 Pain in right knee: Secondary | ICD-10-CM | POA: Diagnosis not present

## 2018-05-13 LAB — POCT UA - MICROALBUMIN
ALBUMIN/CREATININE RATIO, URINE, POC: 30
Creatinine, POC: 300 mg/dL
MICROALBUMIN (UR) POC: 10 mg/L

## 2018-05-13 LAB — POCT URINALYSIS DIPSTICK
Bilirubin, UA: NEGATIVE
Blood, UA: NEGATIVE
GLUCOSE UA: NEGATIVE
Ketones, UA: NEGATIVE
NITRITE UA: NEGATIVE
PH UA: 5.5 (ref 5.0–8.0)
Protein, UA: NEGATIVE
Spec Grav, UA: 1.015 (ref 1.010–1.025)
Urobilinogen, UA: 0.2 E.U./dL

## 2018-05-13 NOTE — Patient Instructions (Signed)
Preventive Care 40-64 Years, Female Preventive care refers to lifestyle choices and visits with your health care provider that can promote health and wellness. What does preventive care include?  A yearly physical exam. This is also called an annual well check.  Dental exams once or twice a year.  Routine eye exams. Ask your health care provider how often you should have your eyes checked.  Personal lifestyle choices, including: ? Daily care of your teeth and gums. ? Regular physical activity. ? Eating a healthy diet. ? Avoiding tobacco and drug use. ? Limiting alcohol use. ? Practicing safe sex. ? Taking low-dose aspirin daily starting at age 58. ? Taking vitamin and mineral supplements as recommended by your health care provider. What happens during an annual well check? The services and screenings done by your health care provider during your annual well check will depend on your age, overall health, lifestyle risk factors, and family history of disease. Counseling Your health care provider may ask you questions about your:  Alcohol use.  Tobacco use.  Drug use.  Emotional well-being.  Home and relationship well-being.  Sexual activity.  Eating habits.  Work and work Statistician.  Method of birth control.  Menstrual cycle.  Pregnancy history.  Screening You may have the following tests or measurements:  Height, weight, and BMI.  Blood pressure.  Lipid and cholesterol levels. These may be checked every 5 years, or more frequently if you are over 81 years old.  Skin check.  Lung cancer screening. You may have this screening every year starting at age 78 if you have a 30-pack-year history of smoking and currently smoke or have quit within the past 15 years.  Fecal occult blood test (FOBT) of the stool. You may have this test every year starting at age 65.  Flexible sigmoidoscopy or colonoscopy. You may have a sigmoidoscopy every 5 years or a colonoscopy  every 10 years starting at age 30.  Hepatitis C blood test.  Hepatitis B blood test.  Sexually transmitted disease (STD) testing.  Diabetes screening. This is done by checking your blood sugar (glucose) after you have not eaten for a while (fasting). You may have this done every 1-3 years.  Mammogram. This may be done every 1-2 years. Talk to your health care provider about when you should start having regular mammograms. This may depend on whether you have a family history of breast cancer.  BRCA-related cancer screening. This may be done if you have a family history of breast, ovarian, tubal, or peritoneal cancers.  Pelvic exam and Pap test. This may be done every 3 years starting at age 80. Starting at age 36, this may be done every 5 years if you have a Pap test in combination with an HPV test.  Bone density scan. This is done to screen for osteoporosis. You may have this scan if you are at high risk for osteoporosis.  Discuss your test results, treatment options, and if necessary, the need for more tests with your health care provider. Vaccines Your health care provider may recommend certain vaccines, such as:  Influenza vaccine. This is recommended every year.  Tetanus, diphtheria, and acellular pertussis (Tdap, Td) vaccine. You may need a Td booster every 10 years.  Varicella vaccine. You may need this if you have not been vaccinated.  Zoster vaccine. You may need this after age 5.  Measles, mumps, and rubella (MMR) vaccine. You may need at least one dose of MMR if you were born in  1957 or later. You may also need a second dose.  Pneumococcal 13-valent conjugate (PCV13) vaccine. You may need this if you have certain conditions and were not previously vaccinated.  Pneumococcal polysaccharide (PPSV23) vaccine. You may need one or two doses if you smoke cigarettes or if you have certain conditions.  Meningococcal vaccine. You may need this if you have certain  conditions.  Hepatitis A vaccine. You may need this if you have certain conditions or if you travel or work in places where you may be exposed to hepatitis A.  Hepatitis B vaccine. You may need this if you have certain conditions or if you travel or work in places where you may be exposed to hepatitis B.  Haemophilus influenzae type b (Hib) vaccine. You may need this if you have certain conditions.  Talk to your health care provider about which screenings and vaccines you need and how often you need them. This information is not intended to replace advice given to you by your health care provider. Make sure you discuss any questions you have with your health care provider. Document Released: 06/15/2015 Document Revised: 02/06/2016 Document Reviewed: 03/20/2015 Elsevier Interactive Patient Education  2018 Elsevier Inc.  

## 2018-05-13 NOTE — Progress Notes (Signed)
Subjective:     Patient ID: Valerie Jenkins , female    DOB: 1961-05-30 , 57 y.o.   MRN: [id number]   Chief Complaint  Patient presents with  . Annual Exam    HPI  Pt is here for annual physical. Had pap and breast xm in March. Mammogram is up to date. Has not started to exercise. Has not made any changes in her diet. She has been gaining weight since she has not been as active, but is eating a lot of junk food. Has been trying to cut out the bread and less fried foods.  Colonoscopy due this year.  Past Medical History:  Diagnosis Date  . Arthritis   . GERD (gastroesophageal reflux disease)   . Hyperlipidemia   . Hypertension   . Seasonal allergies      Family History  Problem Relation Age of Onset  . Diabetes Mother   . Hypertension Mother   . Hyperlipidemia Mother   . Osteoporosis Mother   . Diabetes Father   . Hypertension Father   . Hyperlipidemia Father   . Stroke Father   . Hypertension Sister   . Arthritis Paternal Grandmother      Current Outpatient Medications:  .  aspirin 81 MG chewable tablet, Chew by mouth., Disp: , Rfl:  .  atorvastatin (LIPITOR) 10 MG tablet, Take 1 tablet (10 mg total) by mouth daily., Disp: 90 tablet, Rfl: 3 .  azelastine (OPTIVAR) 0.05 % ophthalmic solution, Place 1 drop into both eyes 2 (two) times daily., Disp: 6 mL, Rfl: 11 .  EPINEPHrine (EPIPEN JR 2-PAK) 0.15 MG/0.3ML injection, Inject 0.3 mLs (0.15 mg total) into the muscle as needed for anaphylaxis., Disp: 2 each, Rfl: 0 .  fluticasone (FLONASE) 50 MCG/ACT nasal spray, USE 2 SPRAYS IN EACH NOSTRIL DAILY, Disp: 16 g, Rfl: 11 .  hydrochlorothiazide (HYDRODIURIL) 12.5 MG tablet, Take 1 tablet (12.5 mg total) by mouth daily., Disp: 30 tablet, Rfl: 0 .  losartan (COZAAR) 100 MG tablet, Take 1 tablet (100 mg total) by mouth daily., Disp: 30 tablet, Rfl: 0 .  montelukast (SINGULAIR) 10 MG tablet, TAKE ONE TABLET BY MOUTH NIGHTLY AT BEDTIME, Disp: 90 tablet, Rfl: 3 .  omeprazole  (PRILOSEC) 40 MG capsule, Take 1 capsule (40 mg total) by mouth daily., Disp: 90 capsule, Rfl: 3   Allergies  Allergen Reactions  . Cyclobenzaprine Swelling  . Flexeril [Cyclobenzaprine Hcl]   . Peanut Oil Swelling  . Peanut-Containing Drug Products   . Shellfish Allergy      Review of Systems  Constitutional: Negative.   HENT: Negative.        Had dental care last week  Eyes: Negative.        Last eye xm Fall 2018  Respiratory: Negative for apnea, cough, shortness of breath, wheezing and stridor.   Cardiovascular: Negative for chest pain, palpitations and leg swelling.  Endocrine: Negative for polydipsia and polyphagia.  Genitourinary: Positive for vaginal discharge. Negative for dysuria, frequency and pelvic pain.  Musculoskeletal: Positive for arthralgias and joint swelling.       Mid finger joints, and L hand gets swollen.   Skin: Negative for color change, pallor, rash and wound.  Neurological: Positive for light-headedness and numbness. Negative for dizziness and headaches.       Hand and feet when sitting and sleeping. Keeps hands under the pillow.  Sometimes gets light headed if she raises up to night.   Hematological: Negative for adenopathy. Does not bruise/bleed easily.  Psychiatric/Behavioral: Positive for sleep disturbance.       Snores. Feels tired after sleeping 6h.     Today's Vitals   05/13/18 1428  BP: 120/70  Pulse: 66  Temp: 97.9 F (36.6 C)  TempSrc: Oral  SpO2: 98%  Weight: 166 lb 12.8 oz (75.7 kg)  Height: 5\' 5"  (1.651 m)   Body mass index is 27.76 kg/m.   Objective:  Physical Exam  BP 120/70 (BP Location: Left Arm, Patient Position: Sitting, Cuff Size: Large)   Pulse 66   Temp 97.9 F (36.6 C) (Oral)   Ht 5\' 5"  (1.651 m)   Wt 166 lb 12.8 oz (75.7 kg)   LMP 04/16/2016   SpO2 98%   BMI 27.76 kg/m   General Appearance:    Alert, cooperative, no distress, appears stated age  Head:    Normocephalic, without obvious abnormality, atraumatic   Eyes:    PERRL, conjunctiva/corneas clear, EOM's intact.  Ears:    Normal TM's and external ear canals, both ears  Nose:   Nares normal, septum midline, mucosa normal, no drainage    or sinus tenderness  Throat:   Lips, mucosa, and tongue normal; teeth and gums normal  Neck:   Supple, symmetrical, trachea midline, no adenopathy;    thyroid:  no enlargement/tenderness/nodules; no carotid   bruit  Back:     Symmetric, no curvature, ROM normal, no CVA tenderness  Lungs:     Clear to auscultation bilaterally, respirations unlabored  Chest Wall:    No tenderness or deformity   Heart:    Regular rate and rhythm, S1 and S2 normal, no murmur, rub   or gallop  Breast Exam:    No tenderness, masses, or nipple abnormality  Abdomen:     Soft, non-tender, bowel sounds active all four quadrants,    no masses, no organomegaly     Rectal:    Normal tone, normal prostate, no masses or tenderness;   guaiac negative stool  Extremities:   Extremities normal, atraumatic, no cyanosis or edema. Both knees have mild crepitions with ROM L>R. Has mild pain with ROM. No deformity of her fingers noted.   Pulses:   2+ and symmetric all extremities  Skin:   Skin color, texture, turgor normal, no rashes or lesions  Lymph nodes:   Cervical, supraclavicular, and axillary nodes normal  Neurologic:   CNII-XII intact, normal strength, sensation and reflexes    Throughout.  Normal Rhomber, tandem gait, tip toe and heel gait and finger to nose.    Assessment And Plan:  1. Chronic pain of both knees- may possibly have OA - DG KNEE 3 VIEW RIGHT; Future - DG Knee 3 Views Left; Future  2. Essential hypertension- stable. May continue same meds - POCT Urinalysis Dipstick (16109) - POCT UA - Microalbumin - EKG 12-Lead- unchanged from 05/09/2014.   3. Encounter for general adult medical examination with abnormal findings- routine. FU 1 y  FU 3 months. She is planning on starting to walk and I advised her to decrease  sugars and gluten which cause more inflammation and her joints to hurt.    Valerie Olivar RODRIGUEZ-SOUTHWORTH, PA-C

## 2018-05-14 ENCOUNTER — Other Ambulatory Visit: Payer: Self-pay | Admitting: Internal Medicine

## 2018-06-01 ENCOUNTER — Ambulatory Visit
Admission: RE | Admit: 2018-06-01 | Discharge: 2018-06-01 | Disposition: A | Discharge status: Home / Self Care | Payer: BLUE CROSS/BLUE SHIELD | Source: Ambulatory Visit | Attending: Internal Medicine | Admitting: Internal Medicine

## 2018-06-01 DIAGNOSIS — G8929 Other chronic pain: Secondary | ICD-10-CM

## 2018-06-01 DIAGNOSIS — M25562 Pain in left knee: Principal | ICD-10-CM

## 2018-06-01 DIAGNOSIS — M25561 Pain in right knee: Principal | ICD-10-CM

## 2018-06-07 ENCOUNTER — Other Ambulatory Visit: Payer: Self-pay | Admitting: Internal Medicine

## 2018-07-03 ENCOUNTER — Other Ambulatory Visit: Payer: Self-pay | Admitting: Internal Medicine

## 2018-07-04 ENCOUNTER — Other Ambulatory Visit: Payer: Self-pay | Admitting: Internal Medicine

## 2018-07-08 ENCOUNTER — Other Ambulatory Visit: Payer: Self-pay | Admitting: Internal Medicine

## 2018-07-29 ENCOUNTER — Other Ambulatory Visit: Payer: Self-pay | Admitting: Internal Medicine

## 2018-07-30 ENCOUNTER — Other Ambulatory Visit: Payer: Self-pay | Admitting: Internal Medicine

## 2018-07-30 NOTE — Telephone Encounter (Signed)
The pharmacy is requesting an alternative medication.

## 2018-07-31 ENCOUNTER — Other Ambulatory Visit: Payer: Self-pay | Admitting: Internal Medicine

## 2018-08-03 ENCOUNTER — Other Ambulatory Visit: Payer: Self-pay | Admitting: Internal Medicine

## 2018-08-03 NOTE — Telephone Encounter (Signed)
Please check NextGen and see is this pt was on Valsartan before, her epic med list does not show this. If not call the pharmacist and tell them, she is not on this med.

## 2018-08-17 ENCOUNTER — Other Ambulatory Visit: Payer: Self-pay | Admitting: Internal Medicine

## 2018-08-17 NOTE — Progress Notes (Unsigned)
Please call pt and ask her if she takes Valsartan, her pharmacy sent a request to order Losartan since that med was out of stock, but we dont have this medication in her med list. I sent you another message a couple of weeks ago, I dont know if you ever got it.

## 2018-08-18 ENCOUNTER — Other Ambulatory Visit: Payer: Self-pay | Admitting: Internal Medicine

## 2018-08-18 NOTE — Progress Notes (Unsigned)
THIS WAS SENT 2 WEEKS AGO AND HAVE NOT HEARD BACK: Please check NextGen and see is this pt was on Valsartan before, her epic med list does not show this. If not call the pharmacist and tell them, she is not on this med.       Documentation     Mariam Dollar, CMA  You 2 weeks ago      The pharmacy is requesting an alternative medication.      Documentation     Interface, Surescripts Out  [facility] Refill 2 weeks ago    Request received via interface.   Routing comment

## 2018-08-19 NOTE — Progress Notes (Signed)
Great.  Thanks

## 2018-09-11 ENCOUNTER — Other Ambulatory Visit: Payer: Self-pay | Admitting: Internal Medicine

## 2018-10-09 ENCOUNTER — Other Ambulatory Visit: Payer: Self-pay | Admitting: Internal Medicine

## 2018-10-11 ENCOUNTER — Encounter: Payer: Self-pay | Admitting: Internal Medicine

## 2018-10-11 ENCOUNTER — Other Ambulatory Visit: Payer: Self-pay | Admitting: Internal Medicine

## 2018-10-11 ENCOUNTER — Telehealth: Payer: Self-pay

## 2018-10-11 NOTE — Telephone Encounter (Signed)
Pt gave verbal consent fir virtual visit

## 2018-10-12 ENCOUNTER — Other Ambulatory Visit: Payer: Self-pay

## 2018-10-12 MED ORDER — LOSARTAN POTASSIUM-HCTZ 100-25 MG PO TABS: 1.0000 | ORAL_TABLET | Freq: Every day | ORAL | 0 refills | Status: DC

## 2018-10-22 ENCOUNTER — Encounter: Payer: Self-pay | Admitting: Nurse Practitioner

## 2018-10-23 ENCOUNTER — Encounter: Payer: Self-pay | Admitting: Nurse Practitioner

## 2018-10-26 ENCOUNTER — Ambulatory Visit: Payer: BLUE CROSS/BLUE SHIELD | Admitting: Nurse Practitioner

## 2018-10-26 ENCOUNTER — Telehealth: Payer: Self-pay | Admitting: Internal Medicine

## 2018-10-26 NOTE — Telephone Encounter (Signed)
PT CALLED TO CANCEL APPT

## 2018-10-26 NOTE — Telephone Encounter (Signed)
PT CLLD TO CANCEL APPT

## 2019-01-04 ENCOUNTER — Telehealth: Payer: Self-pay

## 2019-01-04 ENCOUNTER — Other Ambulatory Visit: Payer: Self-pay | Admitting: Internal Medicine

## 2019-01-04 NOTE — Telephone Encounter (Signed)
Spoke with pt to dicuss coming in for a htn check and pt does not want to make an appt due to being unsatisfied with sylvia. I offered an appt with another provider but pt denied. I informed the pt that I would be sending a 30 day refill but after that she would need an appt for further refills.

## 2019-01-04 NOTE — Telephone Encounter (Signed)
Pt will need an appt for further refills

## 2019-01-18 ENCOUNTER — Encounter: Payer: Self-pay | Admitting: Sports Medicine

## 2019-01-18 ENCOUNTER — Other Ambulatory Visit: Payer: Self-pay

## 2019-01-18 ENCOUNTER — Ambulatory Visit (INDEPENDENT_AMBULATORY_CARE_PROVIDER_SITE_OTHER): Payer: BC Managed Care – PPO

## 2019-01-18 ENCOUNTER — Ambulatory Visit (INDEPENDENT_AMBULATORY_CARE_PROVIDER_SITE_OTHER): Payer: BC Managed Care – PPO | Admitting: Sports Medicine

## 2019-01-18 DIAGNOSIS — M7502 Adhesive capsulitis of left shoulder: Secondary | ICD-10-CM

## 2019-01-18 DIAGNOSIS — M25512 Pain in left shoulder: Secondary | ICD-10-CM | POA: Diagnosis not present

## 2019-01-18 NOTE — Progress Notes (Signed)
Subjective:    CC: Left shoulder pain  HPI:  Over the past 7 months this pleasant 58 year old female has developed worsening pain and loss of range of motion in her left shoulder without trauma, pain is localized around the deltoid without radiation, it is waking her from sleep.  I reviewed the past medical history, family history, social history, surgical history, and allergies today and no changes were needed.  Please see the problem list section below in epic for further details.  Past Medical History: Past Medical History:  Diagnosis Date  . Arthritis   . GERD (gastroesophageal reflux disease)   . Hyperlipidemia   . Hypertension   . Seasonal allergies    Past Surgical History: Past Surgical History:  Procedure Laterality Date  . bartholin cystectomy    . BARTHOLIN GLAND CYST EXCISION    . CERVICAL POLYPECTOMY  1/84  . CESAREAN SECTION  1996   Social History: Social History   Socioeconomic History  . Marital status: Married    Spouse name: Not on file  . Number of children: Not on file  . Years of education: Not on file  . Highest education level: Not on file  Occupational History  . Not on file  Social Needs  . Financial resource strain: Not on file  . Food insecurity    Worry: Not on file    Inability: Not on file  . Transportation needs    Medical: Not on file    Non-medical: Not on file  Tobacco Use  . Smoking status: Never Smoker  . Smokeless tobacco: Never Used  Substance and Sexual Activity  . Alcohol use: Yes    Alcohol/week: 1.0 standard drinks    Types: 1 Standard drinks or equivalent per week  . Drug use: No  . Sexual activity: Yes    Partners: Male  Lifestyle  . Physical activity    Days per week: Not on file    Minutes per session: Not on file  . Stress: Not on file  Relationships  . Social Musicianconnections    Talks on phone: Not on file    Gets together: Not on file    Attends religious service: Not on file    Active member of club or  organization: Not on file    Attends meetings of clubs or organizations: Not on file    Relationship status: Not on file  Other Topics Concern  . Not on file  Social History Narrative  . Not on file   Family History: Family History  Problem Relation Age of Onset  . Diabetes Mother   . Hypertension Mother   . Hyperlipidemia Mother   . Osteoporosis Mother   . Diabetes Father   . Hypertension Father   . Hyperlipidemia Father   . Stroke Father   . Hypertension Sister   . Arthritis Paternal Grandmother    Allergies: Allergies  Allergen Reactions  . Cyclobenzaprine Swelling  . Flexeril [Cyclobenzaprine Hcl]   . Peanut Oil Swelling  . Peanut-Containing Drug Products   . Shellfish Allergy    Medications: See med rec.  Review of Systems: No headache, visual changes, nausea, vomiting, diarrhea, constipation, dizziness, abdominal pain, skin rash, fevers, chills, night sweats, swollen lymph nodes, weight loss, chest pain, body aches, joint swelling, muscle aches, shortness of breath, mood changes, visual or auditory hallucinations.  Objective:    General: Well Developed, well nourished, and in no acute distress.  Neuro: Alert and oriented x3, extra-ocular muscles intact, sensation grossly  intact.  HEENT: Normocephalic, atraumatic, pupils equal round reactive to light, neck supple, no masses, no lymphadenopathy, thyroid nonpalpable.  Skin: Warm and dry, no rashes noted.  Cardiac: Regular rate and rhythm, no murmurs rubs or gallops.  Respiratory: Clear to auscultation bilaterally. Not using accessory muscles, speaking in full sentences.  Abdominal: Soft, nontender, nondistended, positive bowel sounds, no masses, no organomegaly.  Left shoulder: External rotation to 20 degrees, abduction to 35 degrees.  All of these motions cause pain.  Procedure: Real-time Ultrasound Guided injection of the left glenohumeral joint Device: GE Logiq E  Verbal informed consent obtained.  Time-out  conducted.  Noted no overlying erythema, induration, or other signs of local infection.  Skin prepped in a sterile fashion.  Local anesthesia: Topical Ethyl chloride.  With sterile technique and under real time ultrasound guidance:  1 cc Kenalog 40, 2 cc lidocaine, 2 cc bupivacaine injected from a posterior approach into the glenohumeral joint, I then chased this with 10 cc of sterile saline Completed without difficulty  Pain immediately resolved suggesting accurate placement of the medication.  Advised to call if fevers/chills, erythema, induration, drainage, or persistent bleeding.  Images permanently stored and available for review in the ultrasound unit.  Impression: Technically successful ultrasound guided injection.  Impression and Recommendations:    The patient was counselled, risk factors were discussed, anticipatory guidance given.  Adhesive capsulitis of left shoulder High-volume hydrodistention of the right with ultrasound guidance. Referral for physical therapy, x-rays. Return to see me in 4 weeks.   ___________________________________________ Gwen Her. Dianah Field, M.D., ABFM., CAQSM. Primary Care and Sports Medicine [facility]  Adjunct Professor of [employer]

## 2019-01-18 NOTE — Assessment & Plan Note (Signed)
High-volume hydrodistention of the right with ultrasound guidance. Referral for physical therapy, x-rays. Return to see me in 4 weeks.

## 2019-01-24 ENCOUNTER — Encounter: Payer: Self-pay | Admitting: Sports Medicine

## 2019-01-24 NOTE — Telephone Encounter (Signed)
Dr. T please advise

## 2019-01-28 ENCOUNTER — Other Ambulatory Visit: Payer: Self-pay | Admitting: Internal Medicine

## 2019-02-16 ENCOUNTER — Other Ambulatory Visit: Payer: Self-pay

## 2019-02-16 ENCOUNTER — Telehealth: Payer: Self-pay

## 2019-02-16 ENCOUNTER — Encounter: Payer: Self-pay | Admitting: Sports Medicine

## 2019-02-16 ENCOUNTER — Encounter: Payer: Self-pay | Admitting: Internal Medicine

## 2019-02-16 ENCOUNTER — Ambulatory Visit (INDEPENDENT_AMBULATORY_CARE_PROVIDER_SITE_OTHER): Payer: BC Managed Care – PPO | Admitting: Sports Medicine

## 2019-02-16 DIAGNOSIS — M7502 Adhesive capsulitis of left shoulder: Secondary | ICD-10-CM

## 2019-02-16 NOTE — Progress Notes (Signed)
Subjective:    CC: Follow-up  HPI: Valerie Jenkins returns, we did a glenohumeral joint hydrodistention for adhesive capsulitis the last visit, she returns today significantly better.  She has not yet done PT.  I reviewed the past medical history, family history, social history, surgical history, and allergies today and no changes were needed.  Please see the problem list section below in epic for further details.  Past Medical History: Past Medical History:  Diagnosis Date   Arthritis    GERD (gastroesophageal reflux disease)    Hyperlipidemia    Hypertension    Seasonal allergies    Past Surgical History: Past Surgical History:  Procedure Laterality Date   bartholin cystectomy     BARTHOLIN GLAND CYST EXCISION     CERVICAL POLYPECTOMY  1/84   CESAREAN SECTION  1996   Social History: Social History   Socioeconomic History   Marital status: Married    Spouse name: Not on file   Number of children: Not on file   Years of education: Not on file   Highest education level: Not on file  Occupational History   Not on file  Social Needs   Financial resource strain: Not on file   Food insecurity    Worry: Not on file    Inability: Not on file   Transportation needs    Medical: Not on file    Non-medical: Not on file  Tobacco Use   Smoking status: Never Smoker   Smokeless tobacco: Never Used  Substance and Sexual Activity   Alcohol use: Yes    Alcohol/week: 1.0 standard drinks    Types: 1 Standard drinks or equivalent per week   Drug use: No   Sexual activity: Yes    Partners: Male  Lifestyle   Physical activity    Days per week: Not on file    Minutes per session: Not on file   Stress: Not on file  Relationships   Social connections    Talks on phone: Not on file    Gets together: Not on file    Attends religious service: Not on file    Active member of club or organization: Not on file    Attends meetings of clubs or organizations: Not on  file    Relationship status: Not on file  Other Topics Concern   Not on file  Social History Narrative   Not on file   Family History: Family History  Problem Relation Age of Onset   Diabetes Mother    Hypertension Mother    Hyperlipidemia Mother    Osteoporosis Mother    Diabetes Father    Hypertension Father    Hyperlipidemia Father    Stroke Father    Hypertension Sister    Arthritis Paternal Grandmother    Allergies: Allergies  Allergen Reactions   Cyclobenzaprine Swelling   Flexeril [Cyclobenzaprine Hcl]    Peanut Oil Swelling   Peanut-Containing Drug Products    Shellfish Allergy    Medications: See med rec.  Review of Systems: No fevers, chills, night sweats, weight loss, chest pain, or shortness of breath.   Objective:    General: Well Developed, well nourished, and in no acute distress.  Neuro: Alert and oriented x3, extra-ocular muscles intact, sensation grossly intact.  HEENT: Normocephalic, atraumatic, pupils equal round reactive to light, neck supple, no masses, no lymphadenopathy, thyroid nonpalpable.  Skin: Warm and dry, no rashes. Cardiac: Regular rate and rhythm, no murmurs rubs or gallops, no lower extremity edema.  Respiratory: Clear to auscultation bilaterally. Not using accessory muscles, speaking in full sentences. Left shoulder: External rotation now to about 50 degrees, abduction to 70 degrees.  She has a small area of postinjection hypopigmentation at the injection site.  Impression and Recommendations:    Adhesive capsulitis of left shoulder For the most part resolved after glenohumeral injection. She does need to do formal PT. Return in 6 weeks.   ___________________________________________ Gwen Her. Dianah Field, M.D., ABFM., CAQSM. Primary Care and Sports Medicine  [facility]  Adjunct Professor of [place] [employer] [facility] [employer]

## 2019-02-16 NOTE — Telephone Encounter (Signed)
Spoke with pt to make appt for htn for refill medications. Pt has not had an appt since December. Pt was offered appt but refused to schedule. Stated she would call back tmrw.

## 2019-02-16 NOTE — Assessment & Plan Note (Signed)
For the most part resolved after glenohumeral injection. She does need to do formal PT. Return in 6 weeks.

## 2019-02-24 ENCOUNTER — Encounter: Payer: Self-pay | Admitting: Physical Therapy

## 2019-02-24 ENCOUNTER — Other Ambulatory Visit: Payer: Self-pay

## 2019-02-24 ENCOUNTER — Ambulatory Visit (INDEPENDENT_AMBULATORY_CARE_PROVIDER_SITE_OTHER): Payer: BC Managed Care – PPO | Admitting: Physical Therapy

## 2019-02-24 ENCOUNTER — Ambulatory Visit: Payer: BC Managed Care – PPO | Admitting: Physical Therapy

## 2019-02-24 DIAGNOSIS — R293 Abnormal posture: Secondary | ICD-10-CM | POA: Diagnosis not present

## 2019-02-24 DIAGNOSIS — M25512 Pain in left shoulder: Secondary | ICD-10-CM | POA: Diagnosis not present

## 2019-02-24 DIAGNOSIS — M25612 Stiffness of left shoulder, not elsewhere classified: Secondary | ICD-10-CM | POA: Diagnosis not present

## 2019-02-24 DIAGNOSIS — M6281 Muscle weakness (generalized): Secondary | ICD-10-CM | POA: Diagnosis not present

## 2019-02-24 NOTE — Patient Instructions (Signed)
Access Code: [email or web address]  URL: [email or web address]  Date: 02/24/2019  Prepared by: Faustino Congress   Exercises  Standing Shoulder Abduction AAROM with Dowel - 10 reps - 1 sets - 3-5 sec hold - 2x daily - 7x weekly  Standing Shoulder Flexion AAROM with Dowel - 10 reps - 1 sets - 3-5 hold - 2x daily - 7x weekly  Standing Shoulder Internal Rotation AAROM with Dowel - 10 reps - 1 sets - 3-5 sec hold - 2x daily - 7x weekly  Seated Scapular Retraction - 10 reps - 1 sets - 5 sec hold - 2x daily - 7x weekly  Standing Backward Shoulder Rolls - 10 reps - 1 sets - 2x daily - 7x weekly  Patient Education  Trigger Point Dry Needling

## 2019-02-25 DIAGNOSIS — K219 Gastro-esophageal reflux disease without esophagitis: Secondary | ICD-10-CM | POA: Diagnosis not present

## 2019-02-25 DIAGNOSIS — E782 Mixed hyperlipidemia: Secondary | ICD-10-CM | POA: Diagnosis not present

## 2019-02-25 DIAGNOSIS — J309 Allergic rhinitis, unspecified: Secondary | ICD-10-CM | POA: Diagnosis not present

## 2019-02-25 DIAGNOSIS — I10 Essential (primary) hypertension: Secondary | ICD-10-CM | POA: Diagnosis not present

## 2019-02-25 NOTE — Therapy (Signed)
[facility] [place] [address] [place], [place], [address] Phone: [phone]   Fax:  [phone]  Physical Therapy Evaluation  Patient Details  Name: Valerie Jenkins MRN: [id number] Date of Birth: 1960-11-05 Referring Provider (PT): Monica Becton, MD   Encounter Date: 02/24/2019  PT End of Session - 02/24/19 1657    Visit Number  1    Number of Visits  12    Date for PT Re-Evaluation  04/07/19    PT Start Time  1615    PT Stop Time  1655    PT Time Calculation (min)  40 min    Activity Tolerance  Patient tolerated treatment well    Behavior During Therapy  [facility] for tasks assessed/performed       Past Medical History:  Diagnosis Date  . Arthritis   . GERD (gastroesophageal reflux disease)   . Hyperlipidemia   . Hypertension   . Seasonal allergies     Past Surgical History:  Procedure Laterality Date  . bartholin cystectomy    . BARTHOLIN GLAND CYST EXCISION    . CERVICAL POLYPECTOMY  1/84  . CESAREAN SECTION  1996    There were no vitals filed for this visit.   Subjective Assessment - 02/24/19 1617    Subjective  Pt is a 58 y/o female who presents to OPPT for Lt shoulder adhesive capsulitis x 10-11 months.  Pt received injection on 01/18/2019 with improvement in symptoms.  Pt reports some tenderness and difficulty with motion at times.    Patient Stated Goals  regain use of Lt shoulder    Currently in Pain?  Yes    Pain Score  5     Pain Location  Shoulder    Pain Orientation  Left    Pain Descriptors / Indicators  Aching;Tender    Pain Type  Acute pain    Pain Onset  More than a month ago    Pain Frequency  Intermittent    Aggravating Factors   sleeping on Lt side, lifting heavey items    Pain Relieving Factors  injection         [facility] PT Assessment - 02/24/19 1621      Assessment   Medical Diagnosis  M75.02 (ICD-10-CM) - Adhesive capsulitis of left shoulder    Referring Provider (PT)   Monica Becton, MD    Onset Date/Surgical Date  --   Jan/Feb 2020   Hand Dominance  Right    Next MD Visit  04/13/2019    Prior Therapy  at this clinic for knee pain      Precautions   Precautions  None      Restrictions   Weight Bearing Restrictions  No      Balance Screen   Has the patient fallen in the past 6 months  No    Has the patient had a decrease in activity level because of a fear of falling?   No    Is the patient reluctant to leave their home because of a fear of falling?   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    Leisure  watch TV, no regular  exercise      Cognition   Overall Cognitive Status  Within Functional Limits for tasks assessed      Observation/Other Assessments   Focus on Therapeutic Outcomes (FOTO)   59 (41% limited; predicted 33% limited)      Posture/Postural Control   Posture/Postural Control  Postural limitations    Postural Limitations  Rounded Shoulders;Forward head;Increased thoracic kyphosis      ROM / Strength   AROM / PROM / Strength  AROM;Strength;PROM      AROM   AROM Assessment Site  Shoulder    Right/Left Shoulder  Right;Left    Right Shoulder Flexion  156 Degrees    Right Shoulder ABduction  173 Degrees    Right Shoulder Internal Rotation  --   FIR to T8   Right Shoulder External Rotation  --   FER to C7/T1   Left Shoulder Flexion  136 Degrees    Left Shoulder ABduction  91 Degrees    Left Shoulder Internal Rotation  --   FIR to L4/5   Left Shoulder External Rotation  --   FER WNL; tightness present     PROM   PROM Assessment Site  Shoulder    Right/Left Shoulder  Left    Left Shoulder Flexion  146 Degrees    Left Shoulder ABduction  100 Degrees       Strength   Strength Assessment Site  Shoulder    Right/Left Shoulder  Right;Left    Right Shoulder Flexion  5/5    Right Shoulder ABduction  5/5    Right Shoulder Internal Rotation  5/5    Right Shoulder External Rotation  5/5    Left Shoulder Flexion  3+/5    Left Shoulder ABduction  3/5    Left Shoulder Internal Rotation  5/5    Left Shoulder External Rotation  4/5      Palpation   Palpation comment  trigger points in Lt upper trap                Objective measurements completed on examination: See above findings.      [facility] Adult PT Treatment/Exercise - 02/24/19 1621      Exercises   Exercises  Shoulder      Shoulder Exercises: Seated   Retraction  Both;10 reps    Other Seated Exercises  shoulder rolls backwards x 10 reps      Shoulder Exercises: Standing   Internal Rotation  Left;10 reps;AAROM    Internal Rotation Limitations  cane behind back    Flexion  AAROM;Left;10 reps    Flexion Limitations  cane    ABduction  AAROM;Left;10 reps    ABduction Limitations  cane             PT Education - 02/24/19 1656    Education Details  HEP, DN    Person(s) Educated  Patient    Methods  Explanation;Demonstration;Handout    Comprehension  Verbalized understanding;Returned demonstration          PT Long Term Goals - 02/25/19 0750      PT LONG TERM GOAL #1   Title  independent with HEP    Status  New    Target Date  04/08/19      PT LONG TERM GOAL #2   Title  improve Lt shoulder abduction to at least 115 degrees for improved function    Status  New    Target Date  04/08/19      PT LONG  TERM GOAL #3   Title  improve functional internal rotation to at least T8 for improved upper body dressing    Status  New    Target Date  04/08/19      PT LONG TERM GOAL #4   Title  FOTO score improved to </= 33% limited for improved function    Status  New    Target Date  04/08/19      PT LONG TERM GOAL #5   Title  n/a             Plan -  02/25/19 0747    Clinical Impression Statement  Pt is a 58 y/o female who presents to OPPT for Lt shoulder adhesive capsulitis.  Pt demonstrates decreased ROM and strength, postural abnormalities and active trigger points affecting functional mobility.  Pt will benefit from PT to address deficits listed.    Examination-Activity Limitations  Bathing;Dressing;Lift;Carry;Reach Overhead    Examination-Participation Restrictions  Cleaning    Stability/Clinical Decision Making  Stable/Uncomplicated    Clinical Decision Making  Low    Rehab Potential  Good    PT Frequency  2x / week    PT Duration  6 weeks    PT Treatment/Interventions  ADLs/Self Care Home Management;Cryotherapy;Electrical Stimulation;Ultrasound;Moist Heat;Iontophoresis 4mg /ml Dexamethasone;Therapeutic activities;Therapeutic exercise;Patient/family education;Passive range of motion;Manual techniques;Dry needling;Taping;Vasopneumatic Device    PT Next Visit Plan  review HEP, manual/modalities/DN for ROM and decrease muscle tightness    PT Home Exercise Plan  Access Code: [email or web address]    Consulted and Agree with Plan of Care  Patient       Patient will benefit from skilled therapeutic intervention in order to improve the following deficits and impairments:  Decreased range of motion, Increased fascial restricitons, Increased muscle spasms, Pain, Impaired UE functional use, Decreased strength, Postural dysfunction  Visit Diagnosis: Stiffness of left shoulder, not elsewhere classified - Plan: PT plan of care cert/re-cert  Acute pain of left shoulder - Plan: PT plan of care cert/re-cert  Abnormal posture - Plan: PT plan of care cert/re-cert  Muscle weakness (generalized) - Plan: PT plan of care cert/re-cert     Problem List Patient Active Problem List   Diagnosis Date Noted  . Adhesive capsulitis of left shoulder 01/18/2019  . BMI 29.0-29.9,adult 07/28/2015  . HTN (hypertension) 04/14/2012  . Hyperlipidemia 04/14/2012  . AR  (allergic rhinitis) 04/14/2012  . GERD (gastroesophageal reflux disease) 04/14/2012  . Osteoarthritis of both knees 04/14/2012  . Vitamin deficiency 04/14/2012      Clarita CraneStephanie F Jayona Mccaig, PT, DPT 02/25/19 7:53 AM     [facility] [address] [place], [place], [address] Phone: [phone]   Fax:  [phone]  Name: Aundria RudBarbara T Callari MRN: [id number] Date of Birth: [birth year]

## 2019-03-03 ENCOUNTER — Other Ambulatory Visit: Payer: Self-pay

## 2019-03-03 ENCOUNTER — Encounter: Payer: Self-pay | Admitting: Physical Therapy

## 2019-03-03 ENCOUNTER — Ambulatory Visit (INDEPENDENT_AMBULATORY_CARE_PROVIDER_SITE_OTHER): Payer: BC Managed Care – PPO | Admitting: Physical Therapy

## 2019-03-03 DIAGNOSIS — M25512 Pain in left shoulder: Secondary | ICD-10-CM | POA: Diagnosis not present

## 2019-03-03 DIAGNOSIS — M25612 Stiffness of left shoulder, not elsewhere classified: Secondary | ICD-10-CM | POA: Diagnosis not present

## 2019-03-03 DIAGNOSIS — M6281 Muscle weakness (generalized): Secondary | ICD-10-CM

## 2019-03-03 DIAGNOSIS — R293 Abnormal posture: Secondary | ICD-10-CM | POA: Diagnosis not present

## 2019-03-03 NOTE — Therapy (Signed)
[place] [address] [place], [place], [address] Phone: [phone]   Fax:  [phone]  Physical Therapy Treatment  Patient Details  Name: MALAIA BUCHTA MRN: [id number] Date of Birth: 1961/03/08 Referring Provider (PT): Silverio Decamp, MD   Encounter Date: 03/03/2019  PT End of Session - 03/03/19 1344    Visit Number  2    Number of Visits  12    Date for PT Re-Evaluation  04/07/19    PT Start Time  3546    PT Stop Time  1226    PT Time Calculation (min)  41 min    Activity Tolerance  Patient tolerated treatment well    Behavior During Therapy  [facility] for tasks assessed/performed       Past Medical History:  Diagnosis Date  . Arthritis   . GERD (gastroesophageal reflux disease)   . Hyperlipidemia   . Hypertension   . Seasonal allergies     Past Surgical History:  Procedure Laterality Date  . bartholin cystectomy    . BARTHOLIN GLAND CYST EXCISION    . CERVICAL POLYPECTOMY  1/84  . CESAREAN SECTION  1996    There were no vitals filed for this visit.  Subjective Assessment - 03/03/19 1146    Subjective  Pt is a 58 y/o female who presents to OPPT for Lt shoulder adhesive capsulitis x 10-11 months.  Pt received injection on 01/18/2019 with improvement in symptoms.  Pt reports some tenderness and difficulty with motion at times.    Patient Stated Goals  regain use of Lt shoulder    Currently in Pain?  Yes    Pain Score  4     Pain Location  Shoulder    Pain Orientation  Left    Pain Descriptors / Indicators  Aching;Tender;Tightness    Pain Onset  More than a month ago    Pain Frequency  Intermittent    Aggravating Factors   sleeping on Lt side, lifting heavy items    Pain Relieving Factors  injection         [facility] PT Assessment - 03/03/19 1156      Assessment   Medical Diagnosis  M75.02 (ICD-10-CM) - Adhesive capsulitis of left shoulder    Referring Provider (PT)  Silverio Decamp,  MD                   [facility] Adult PT Treatment/Exercise - 03/03/19 1148      Shoulder Exercises: Seated   Retraction  Both;10 reps    Retraction Limitations  5 sec hold    External Rotation  Both;10 reps    External Rotation Limitations  5 sec hold    Other Seated Exercises  shoulder rolls backwards x 10 reps      Shoulder Exercises: Standing   Internal Rotation  Left;10 reps;AAROM    Internal Rotation Limitations  cane behind back    Flexion  AAROM;Left;10 reps    Flexion Limitations  cane    ABduction  AAROM;Left;10 reps    ABduction Limitations  cane      Shoulder Exercises: Pulleys   Flexion  3 minutes    Scaption  3 minutes      Manual Therapy   Manual Therapy  Soft tissue mobilization    Manual therapy comments  skilled palpation and monitoring of soft tissue; pt supine and prone    Soft tissue mobilization  Lt upper trap/levator/infraspinatus and teres minor  Trigger Point Dry Needling - 03/03/19 1344    Consent Given?  Yes    Education Handout Provided  Previously provided    Muscles Treated Head and Neck  Upper trapezius;Levator scapulae    Upper Trapezius Response  Twitch reponse elicited;Palpable increased muscle length    Levator Scapulae Response  Twitch response elicited;Palpable increased muscle length                PT Long Term Goals - 02/25/19 0750      PT LONG TERM GOAL #1   Title  independent with HEP    Status  New    Target Date  04/08/19      PT LONG TERM GOAL #2   Title  improve Lt shoulder abduction to at least 115 degrees for improved function    Status  New    Target Date  04/08/19      PT LONG TERM GOAL #3   Title  improve functional internal rotation to at least T8 for improved upper body dressing    Status  New    Target Date  04/08/19      PT LONG TERM GOAL #4   Title  FOTO score improved to </= 33% limited for improved function    Status  New    Target Date  04/08/19      PT LONG TERM GOAL #5    Title  n/a            Plan - 03/03/19 1344    Clinical Impression Statement  Pt needed min cues to review HEP today, and overall motion of shoulder slowly improving.  Pt demonstrated positive response to DN today.  No goals met as only 2nd visit.    Examination-Activity Limitations  Bathing;Dressing;Lift;Carry;Reach Overhead    Examination-Participation Restrictions  Cleaning    Stability/Clinical Decision Making  Stable/Uncomplicated    Rehab Potential  Good    PT Frequency  2x / week    PT Duration  6 weeks    PT Treatment/Interventions  ADLs/Self Care Home Management;Cryotherapy;Electrical Stimulation;Ultrasound;Moist Heat;Iontophoresis 79m/ml Dexamethasone;Therapeutic activities;Therapeutic exercise;Patient/family education;Passive range of motion;Manual techniques;Dry needling;Taping;Vasopneumatic Device    PT Next Visit Plan  review HEP, manual/modalities/DN for ROM and decrease muscle tightness; assess response to DN    PT Home Exercise Plan  Access Code: [email or web address]    Consulted and Agree with Plan of Care  Patient       Patient will benefit from skilled therapeutic intervention in order to improve the following deficits and impairments:  Decreased range of motion, Increased fascial restricitons, Increased muscle spasms, Pain, Impaired UE functional use, Decreased strength, Postural dysfunction  Visit Diagnosis: Stiffness of left shoulder, not elsewhere classified  Acute pain of left shoulder  Abnormal posture  Muscle weakness (generalized)     Problem List Patient Active Problem List   Diagnosis Date Noted  . Adhesive capsulitis of left shoulder 01/18/2019  . BMI 29.0-29.9,adult 07/28/2015  . HTN (hypertension) 04/14/2012  . Hyperlipidemia 04/14/2012  . AR (allergic rhinitis) 04/14/2012  . GERD (gastroesophageal reflux disease) 04/14/2012  . Osteoarthritis of both knees 04/14/2012  . Vitamin deficiency 04/14/2012      SLaureen Abrahams PT, DPT 03/03/19  1:46 PM     [facility] [address]Phone: [phone]  Fax:  [phone] Name: BMELISA DONOFRIOMRN: [id number] of Birth: [birth year]

## 2019-03-07 ENCOUNTER — Other Ambulatory Visit: Payer: Self-pay | Admitting: Physician Assistant

## 2019-03-07 DIAGNOSIS — Z1231 Encounter for screening mammogram for malignant neoplasm of breast: Secondary | ICD-10-CM

## 2019-03-10 ENCOUNTER — Encounter: Payer: Self-pay | Admitting: Physical Therapy

## 2019-03-10 ENCOUNTER — Other Ambulatory Visit: Payer: Self-pay

## 2019-03-10 ENCOUNTER — Ambulatory Visit (INDEPENDENT_AMBULATORY_CARE_PROVIDER_SITE_OTHER): Payer: BC Managed Care – PPO | Admitting: Physical Therapy

## 2019-03-10 DIAGNOSIS — M25512 Pain in left shoulder: Secondary | ICD-10-CM | POA: Diagnosis not present

## 2019-03-10 DIAGNOSIS — M6281 Muscle weakness (generalized): Secondary | ICD-10-CM

## 2019-03-10 DIAGNOSIS — R293 Abnormal posture: Secondary | ICD-10-CM | POA: Diagnosis not present

## 2019-03-10 DIAGNOSIS — M25612 Stiffness of left shoulder, not elsewhere classified: Secondary | ICD-10-CM

## 2019-03-10 NOTE — Patient Instructions (Signed)
Access Code: [email or web address]  URL: [email or web address]  Date: 03/10/2019  Prepared by: Faustino Congress   Exercises  Standing Shoulder Abduction AAROM with Dowel - 10 reps - 1 sets - 3-5 sec hold - 2x daily - 7x weekly  Standing Shoulder Flexion AAROM with Dowel - 10 reps - 1 sets - 3-5 hold - 2x daily - 7x weekly  Standing Shoulder Internal Rotation AAROM with Dowel - 10 reps - 1 sets - 3-5 sec hold - 2x daily - 7x weekly  Seated Scapular Retraction - 10 reps - 1 sets - 5 sec hold - 2x daily - 7x weekly  Standing Backward Shoulder Rolls - 10 reps - 1 sets - 2x daily - 7x weekly  Supine Shoulder Horizontal Abduction with Resistance - 15 reps - 1 sets - 2x daily - 7x weekly  Supine Shoulder External Rotation on Foam Roll with Theraband - 15 reps - 1 sets - 1x daily - 7x weekly  Supine Shoulder Flexion with Anchored Resistance - 15 reps - 1 sets - 1x daily - 7x weekly  Scapular Retraction with Resistance - 15 reps - 1 sets - 5 sec hold - 1x daily - 7x weekly  Scapular Retraction with Resistance Advanced - 15 reps - 1 sets - 5 sec hold - 1x daily - 7x weekly  Patient Education  Trigger Point Dry Needling

## 2019-03-10 NOTE — Therapy (Signed)
[facility] [place] [address] [place], [place], [address] Phone: [phone]   Fax:  [phone]  Physical Therapy Treatment  Patient Details  Name: Valerie Jenkins MRN: [id number] Date of Birth: 1960-07-09 Referring Provider (PT): Monica Becton, MD   Encounter Date: 03/10/2019  PT End of Session - 03/10/19 1700    Visit Number  3    Number of Visits  12    Date for PT Re-Evaluation  04/07/19    PT Start Time  1617    PT Stop Time  1658    PT Time Calculation (min)  41 min    Activity Tolerance  Patient tolerated treatment well    Behavior During Therapy  [facility] for tasks assessed/performed       Past Medical History:  Diagnosis Date  . Arthritis   . GERD (gastroesophageal reflux disease)   . Hyperlipidemia   . Hypertension   . Seasonal allergies     Past Surgical History:  Procedure Laterality Date  . bartholin cystectomy    . BARTHOLIN GLAND CYST EXCISION    . CERVICAL POLYPECTOMY  1/84  . CESAREAN SECTION  1996    There were no vitals filed for this visit.  Subjective Assessment - 03/10/19 1618    Subjective  shoulder is getting better; able to sleep on side without pain, and can almost reach to bra strap on the back.    Patient Stated Goals  regain use of Lt shoulder    Currently in Pain?  Yes    Pain Score  2     Pain Location  Shoulder    Pain Orientation  Left    Pain Descriptors / Indicators  Aching;Tender;Tightness    Pain Type  Acute pain    Pain Onset  More than a month ago                       [facility] Adult PT Treatment/Exercise - 03/10/19 1620      Shoulder Exercises: Supine   Horizontal ABduction  Both;15 reps;Theraband    Theraband Level (Shoulder Horizontal ABduction)  Level 2 (Red)    External Rotation  Both;15 reps;Theraband    Theraband Level (Shoulder External Rotation)  Level 2 (Red)    Flexion  15 reps;Theraband;Both    Theraband Level (Shoulder Flexion)   Level 2 (Red)      Shoulder Exercises: Standing   Horizontal ABduction  Both;10 reps;Weights    Horizontal ABduction Weight (lbs)  2    Horizontal ABduction Limitations  fatigue noted    Flexion  Strengthening;Both;15 reps;Weights    Shoulder Flexion Weight (lbs)  2    ABduction  Both;15 reps;Weights    Shoulder ABduction Weight (lbs)  2    Extension  Both;15 reps;Theraband    Theraband Level (Shoulder Extension)  Level 2 (Red)    Row  Both;15 reps;Theraband    Theraband Level (Shoulder Row)  Level 2 (Red)    Other Standing Exercises  bent over rows x15; 2# bil      Shoulder Exercises: Pulleys   Flexion  3 minutes    Scaption  3 minutes    ABduction Limitations  internal rotation standing x 3 min      Manual Therapy   Manual Therapy  Soft tissue mobilization    Manual therapy comments  pt also used theracane for myofascial and trigger point release    Soft tissue mobilization  Lt upper trap/levator  PT Education - 03/10/19 1700    Education Details  strengthening HEP    Person(s) Educated  Patient    Methods  Explanation;Demonstration;Handout    Comprehension  Verbalized understanding;Returned demonstration;Need further instruction          PT Long Term Goals - 02/25/19 0750      PT LONG TERM GOAL #1   Title  independent with HEP    Status  New    Target Date  04/08/19      PT LONG TERM GOAL #2   Title  improve Lt shoulder abduction to at least 115 degrees for improved function    Status  New    Target Date  04/08/19      PT LONG TERM GOAL #3   Title  improve functional internal rotation to at least T8 for improved upper body dressing    Status  New    Target Date  04/08/19      PT LONG TERM GOAL #4   Title  FOTO score improved to </= 33% limited for improved function    Status  New    Target Date  04/08/19      PT LONG TERM GOAL #5   Title  n/a            Plan - 03/10/19 1700    Clinical Impression Statement  Pt demonstrated  nearly full ROM today with LT shoulder and progressed strengthening today.  May be ready for d/c next visit if she continues to progress well.    Examination-Activity Limitations  Bathing;Dressing;Lift;Carry;Reach Overhead    Examination-Participation Restrictions  Cleaning    Stability/Clinical Decision Making  Stable/Uncomplicated    Rehab Potential  Good    PT Frequency  2x / week    PT Duration  6 weeks    PT Treatment/Interventions  ADLs/Self Care Home Management;Cryotherapy;Electrical Stimulation;Ultrasound;Moist Heat;Iontophoresis 4mg /ml Dexamethasone;Therapeutic activities;Therapeutic exercise;Patient/family education;Passive range of motion;Manual techniques;Dry needling;Taping;Vasopneumatic Device    PT Next Visit Plan  measure ROM, discuss possible d/c or hold    PT Home Exercise Plan  Access Code: [email or web address]    Consulted and Agree with Plan of Care  Patient       Patient will benefit from skilled therapeutic intervention in order to improve the following deficits and impairments:  Decreased range of motion, Increased fascial restricitons, Increased muscle spasms, Pain, Impaired UE functional use, Decreased strength, Postural dysfunction  Visit Diagnosis: Stiffness of left shoulder, not elsewhere classified  Acute pain of left shoulder  Abnormal posture  Muscle weakness (generalized)     Problem List Patient Active Problem List   Diagnosis Date Noted  . Adhesive capsulitis of left shoulder 01/18/2019  . BMI 29.0-29.9,adult 07/28/2015  . HTN (hypertension) 04/14/2012  . Hyperlipidemia 04/14/2012  . AR (allergic rhinitis) 04/14/2012  . GERD (gastroesophageal reflux disease) 04/14/2012  . Osteoarthritis of both knees 04/14/2012  . Vitamin deficiency 04/14/2012      Laureen Abrahams, PT, DPT 03/10/19 5:02 PM     [facility] [place] [address] [place], [place], [address] Phone: [phone]   Fax:   [phone]  Name: Valerie Jenkins MRN: [id number] Date of Birth: 01-30-1961

## 2019-03-17 ENCOUNTER — Encounter: Payer: BC Managed Care – PPO | Admitting: Physical Therapy

## 2019-03-23 DIAGNOSIS — N951 Menopausal and female climacteric states: Secondary | ICD-10-CM | POA: Diagnosis not present

## 2019-03-23 DIAGNOSIS — Z8262 Family history of osteoporosis: Secondary | ICD-10-CM | POA: Diagnosis not present

## 2019-03-23 DIAGNOSIS — Z1151 Encounter for screening for human papillomavirus (HPV): Secondary | ICD-10-CM | POA: Diagnosis not present

## 2019-03-23 DIAGNOSIS — B9689 Other specified bacterial agents as the cause of diseases classified elsewhere: Secondary | ICD-10-CM | POA: Diagnosis not present

## 2019-03-23 DIAGNOSIS — M810 Age-related osteoporosis without current pathological fracture: Secondary | ICD-10-CM | POA: Diagnosis not present

## 2019-03-23 DIAGNOSIS — M8580 Other specified disorders of bone density and structure, unspecified site: Secondary | ICD-10-CM | POA: Diagnosis not present

## 2019-03-23 DIAGNOSIS — R232 Flushing: Secondary | ICD-10-CM | POA: Diagnosis not present

## 2019-03-23 DIAGNOSIS — Z01419 Encounter for gynecological examination (general) (routine) without abnormal findings: Secondary | ICD-10-CM | POA: Diagnosis not present

## 2019-03-23 DIAGNOSIS — N760 Acute vaginitis: Secondary | ICD-10-CM | POA: Diagnosis not present

## 2019-03-24 ENCOUNTER — Other Ambulatory Visit: Payer: Self-pay

## 2019-03-24 ENCOUNTER — Ambulatory Visit (INDEPENDENT_AMBULATORY_CARE_PROVIDER_SITE_OTHER): Payer: BC Managed Care – PPO | Admitting: Rehabilitative and Restorative Service Providers"

## 2019-03-24 ENCOUNTER — Encounter: Payer: Self-pay | Admitting: Rehabilitative and Restorative Service Providers"

## 2019-03-24 ENCOUNTER — Ambulatory Visit: Payer: BC Managed Care – PPO

## 2019-03-24 DIAGNOSIS — R293 Abnormal posture: Secondary | ICD-10-CM

## 2019-03-24 DIAGNOSIS — M25512 Pain in left shoulder: Secondary | ICD-10-CM | POA: Diagnosis not present

## 2019-03-24 DIAGNOSIS — M25612 Stiffness of left shoulder, not elsewhere classified: Secondary | ICD-10-CM | POA: Diagnosis not present

## 2019-03-24 DIAGNOSIS — M6281 Muscle weakness (generalized): Secondary | ICD-10-CM

## 2019-03-24 NOTE — Patient Instructions (Signed)
Access Code: [email or web address]  URL: [email or web address]  Date: 03/24/2019  Prepared by: Rudell Cobb   Exercises Standing Shoulder Internal Rotation Stretch with Towel - 2 reps - 1 sets - 30 seconds hold - 2x daily - 7x weekly Standing Shoulder Abduction AAROM with Dowel - 10 reps - 1 sets - 3-5 sec hold - 2x daily - 7x weekly Standing Shoulder Flexion AAROM with Dowel - 10 reps - 1 sets - 3-5 hold - 2x daily - 7x weekly Standing Backward Shoulder Rolls - 10 reps - 1 sets - 2x daily - 7x weekly Standing Full Range Shoulder Flexion with Dumbbells - 8 reps - 1 sets - 1-2x daily - 7x weekly Shoulder Abduction with Dumbbells - Thumbs Up - 8 reps - 1 sets                            - 1-2x daily - 7x weekly Standing Shoulder Horizontal Abduction with Dumbbells - Thumbs Up - 8 reps - 1 sets - 1-2x daily - 7x weekly Scapular Retraction with Resistance - 15 reps - 1 sets - 5 sec hold - 1x daily - 7x weekly Scapular Retraction with Resistance Advanced - 15 reps - 1 sets - 5 sec hold - 1x daily - 7x weekly Supine Shoulder Horizontal Abduction with Resistance - 15 reps - 1 sets - 2x daily - 7x weekly Supine Shoulder External Rotation with Dowel - 3 reps - 1 sets - 30 seconds hold - 2x daily - 7x weekly Supine Shoulder External Rotation on Foam Roll with Theraband - 15 reps - 1 sets - 1x daily - 7x weekly Supine Shoulder Flexion with Anchored Resistance - 15 reps - 1 sets - 1x daily - 7x weekly Patient Education Trigger Point Dry Needling

## 2019-03-24 NOTE — Therapy (Addendum)
[place], [place], [address] Phone: [phone]   Fax:  [phone]  Physical Therapy Treatment  And Discharge Summary  Patient Details  Name: Valerie Jenkins MRN: [id number] Date of Birth: 06/09/1960 Referring Provider (PT): Silverio Decamp, MD   Encounter Date: 03/24/2019  PT End of Session - 03/24/19 1716    Visit Number  4    Number of Visits  12    Date for PT Re-Evaluation  04/07/19    PT Start Time  1620    PT Stop Time  1700    PT Time Calculation (min)  40 min    Activity Tolerance  Patient tolerated treatment well    Behavior During Therapy  [facility] for tasks assessed/performed       Past Medical History:  Diagnosis Date  . Arthritis   . GERD (gastroesophageal reflux disease)   . Hyperlipidemia   . Hypertension   . Seasonal allergies     Past Surgical History:  Procedure Laterality Date  . bartholin cystectomy    . BARTHOLIN GLAND CYST EXCISION    . CERVICAL POLYPECTOMY  1/84  . CESAREAN SECTION  1996    There were no vitals filed for this visit.  Subjective Assessment - 03/24/19 1620    Subjective  doing well, motion and pain is improved    Patient Stated Goals  regain use of Lt shoulder    Currently in Pain?  No/denies    Pain Onset  More than a month ago         [facility] PT Assessment - 03/24/19 1626      AROM   Left Shoulder Flexion  160 Degrees    Left Shoulder ABduction  170 Degrees    Left Shoulder Internal Rotation  50 Degrees   shoulder starting at 90 degrees abduction   Left Shoulder External Rotation  60 Degrees   shoulder abducted to 90 degrees                  [facility] Adult PT Treatment/Exercise - 03/24/19 1620      Self-Care   Self-Care  Other Self-Care Comments    Other Self-Care Comments   discussed progression of HEP and community activity recommending regular walking.      Exercises   Exercises  Shoulder      Shoulder  Exercises: Supine   Horizontal ABduction  Both;10 reps    Theraband Level (Shoulder Horizontal ABduction)  Level 2 (Red)    Horizontal ABduction Limitations  supine    External Rotation  Both;5 reps    Theraband Level (Shoulder External Rotation)  Level 2 (Red)    Flexion  Strengthening;Left;10 reps    Theraband Level (Shoulder Flexion)  Level 2 (Red)      Shoulder Exercises: Sidelying   Flexion  AROM;Left    Flexion Limitations  with manual STM at subscapularis      Shoulder Exercises: Standing   Horizontal ABduction  Both   8 reps   Horizontal ABduction Weight (lbs)  2 lbs    Horizontal ABduction Limitations  fatigue and tremor viewed     Internal Rotation  Left;5 reps    Internal Rotation Limitations  cane behind back and towel to increase stretch    Flexion  Strengthening;Left   8 reps   Shoulder Flexion Weight (lbs)  2 lbs    Flexion Limitations  also tried with theraband in standing/ feels more resistance with band supine  ABduction  Strengthening;Both;Weights   8 reps   Shoulder ABduction Weight (lbs)  2      Shoulder Exercises: Pulleys   Flexion  3 minutes    Scaption  3 minutes             PT Education - 03/24/19 1715    Education Details  Updated HEP adding standing resistance with 2 lb weights ( patient to alternate exercises in medbridge)    Person(s) Educated  Patient    Methods  Explanation;Demonstration;Handout    Comprehension  Verbalized understanding;Returned demonstration          PT Long Term Goals - 03/24/19 1655      PT LONG TERM GOAL #1   Title  independent with HEP    Status Achieved     PT LONG TERM GOAL #2   Title  improve Lt shoulder abduction to at least 115 degrees for improved function    Baseline  170 degrees    Status  Achieved      PT LONG TERM GOAL #3   Title  improve functional internal rotation to at least T8 for improved upper body dressing    Baseline  reaches to bra strap    Status  Achieved      PT LONG TERM  GOAL #4   Title  FOTO score improved to </= 33% limited for improved function    Baseline  Improved from 59 up to 80%    Status  Achieved      PT LONG TERM GOAL #5   Title  n/a            Plan - 03/24/19 1723    Clinical Impression Statement  The patient has met 3 LTGs.  We discussed d/c  versus continuing.  We decided to schedule one further visit in 2 weeks to assess newly added resistance HEP to ensure progressing well.  If patient does not feel she needs this visit, she will call to cancel.  Patient is continuing to progress with ROM and with strengthening.    PT Treatment/Interventions  ADLs/Self Care Home Management;Cryotherapy;Electrical Stimulation;Ultrasound;Moist Heat;Iontophoresis '4mg'$ /ml Dexamethasone;Therapeutic activities;Therapeutic exercise;Patient/family education;Passive range of motion;Manual techniques;Dry needling;Taping;Vasopneumatic Device    PT Next Visit Plan  check HEP, discharge.    Consulted and Agree with Plan of Care  Patient       Patient will benefit from skilled therapeutic intervention in order to improve the following deficits and impairments:  Decreased range of motion, Increased fascial restricitons, Increased muscle spasms, Pain, Impaired UE functional use, Decreased strength, Postural dysfunction  Visit Diagnosis: Stiffness of left shoulder, not elsewhere classified  Acute pain of left shoulder  Abnormal posture  Muscle weakness (generalized)    PHYSICAL THERAPY DISCHARGE SUMMARY  Visits from Start of Care: 4  Current functional level related to goals / functional outcomes: See above   Remaining deficits: Mild limitation IR/ER   Education / Equipment: Home program  Plan: Patient agrees to discharge.  Patient goals were met. Patient is being discharged due to meeting the stated rehab goals.  ?????          Problem List Patient Active Problem List   Diagnosis Date Noted  . Adhesive capsulitis of left shoulder 01/18/2019   . BMI 29.0-29.9,adult 07/28/2015  . HTN (hypertension) 04/14/2012  . Hyperlipidemia 04/14/2012  . AR (allergic rhinitis) 04/14/2012  . GERD (gastroesophageal reflux disease) 04/14/2012  . Osteoarthritis of both knees 04/14/2012  . Vitamin deficiency 04/14/2012    Thank  you for the referral of this patient. Rudell Cobb, MPT   [place], PT 03/24/2019, 5:25 PM  [facility] [place], [place], [address] Phone: [phone]   Fax:  [phone]  Name: Valerie Jenkins MRN: [id number] Date of Birth: March 04, 1961

## 2019-04-01 ENCOUNTER — Ambulatory Visit: Payer: BC Managed Care – PPO

## 2019-04-07 ENCOUNTER — Encounter: Payer: BC Managed Care – PPO | Admitting: Rehabilitative and Restorative Service Providers"

## 2019-04-13 ENCOUNTER — Ambulatory Visit: Payer: BC Managed Care – PPO | Admitting: Sports Medicine

## 2019-04-21 ENCOUNTER — Encounter: Payer: Self-pay | Admitting: Rehabilitative and Restorative Service Providers"

## 2019-05-16 ENCOUNTER — Other Ambulatory Visit: Payer: Self-pay

## 2019-05-16 ENCOUNTER — Ambulatory Visit
Admission: RE | Admit: 2019-05-16 | Discharge: 2019-05-16 | Disposition: A | Discharge status: Home / Self Care | Payer: BC Managed Care – PPO | Source: Ambulatory Visit | Attending: Physician Assistant | Admitting: Physician Assistant

## 2019-05-16 DIAGNOSIS — Z1231 Encounter for screening mammogram for malignant neoplasm of breast: Secondary | ICD-10-CM

## 2019-05-17 ENCOUNTER — Ambulatory Visit: Payer: BC Managed Care – PPO

## 2019-05-19 ENCOUNTER — Encounter: Payer: BLUE CROSS/BLUE SHIELD | Admitting: Internal Medicine

## 2019-08-15 IMAGING — CR DG KNEE 3 VIEWS*R*
3 series · 3 of 3 positions shown · non-contrast
Comparison: LEFT knee reported separately.

CLINICAL DATA: Chronic BILATERAL knee pain.

EXAM:
RIGHT KNEE - 3 VIEW

[w knee ap right]
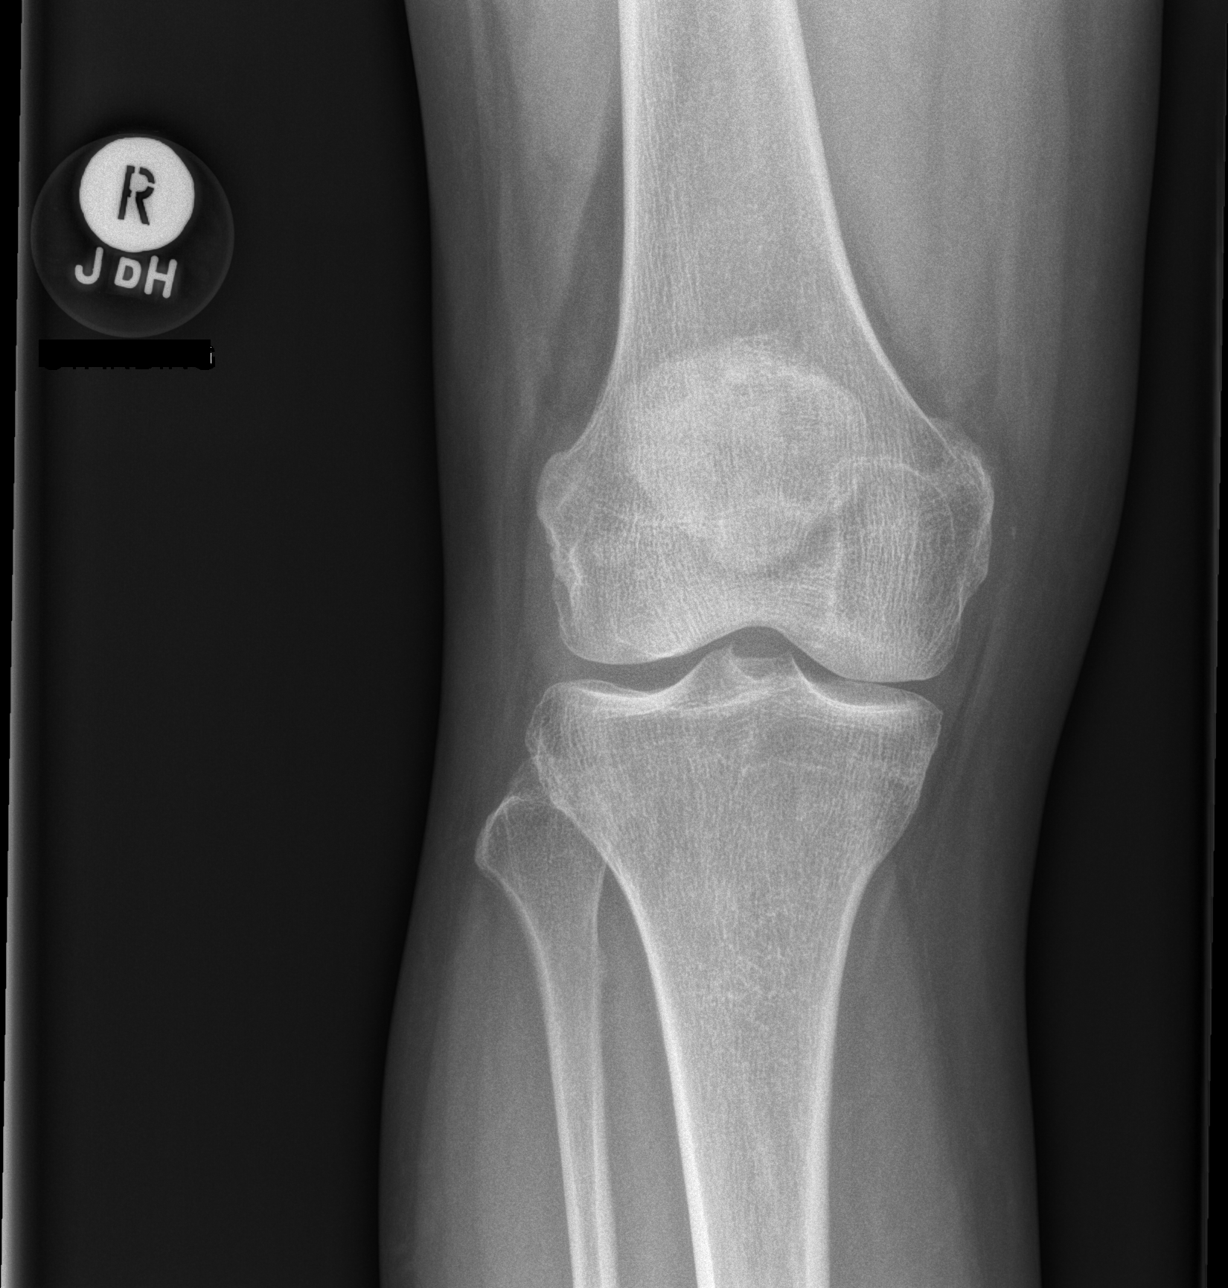

[w knee obl right]
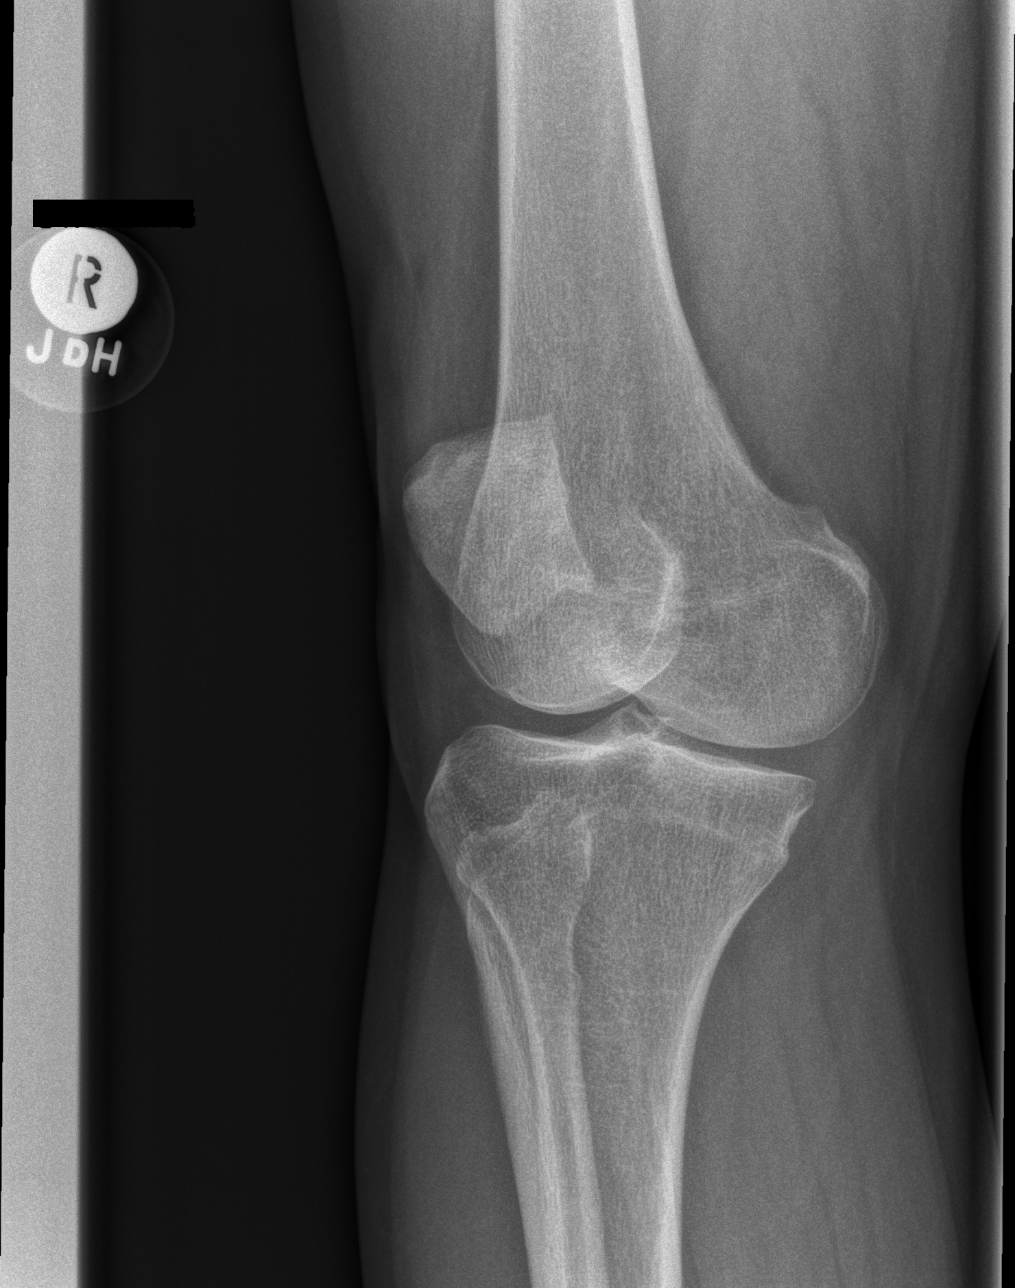

[w knee lat right]
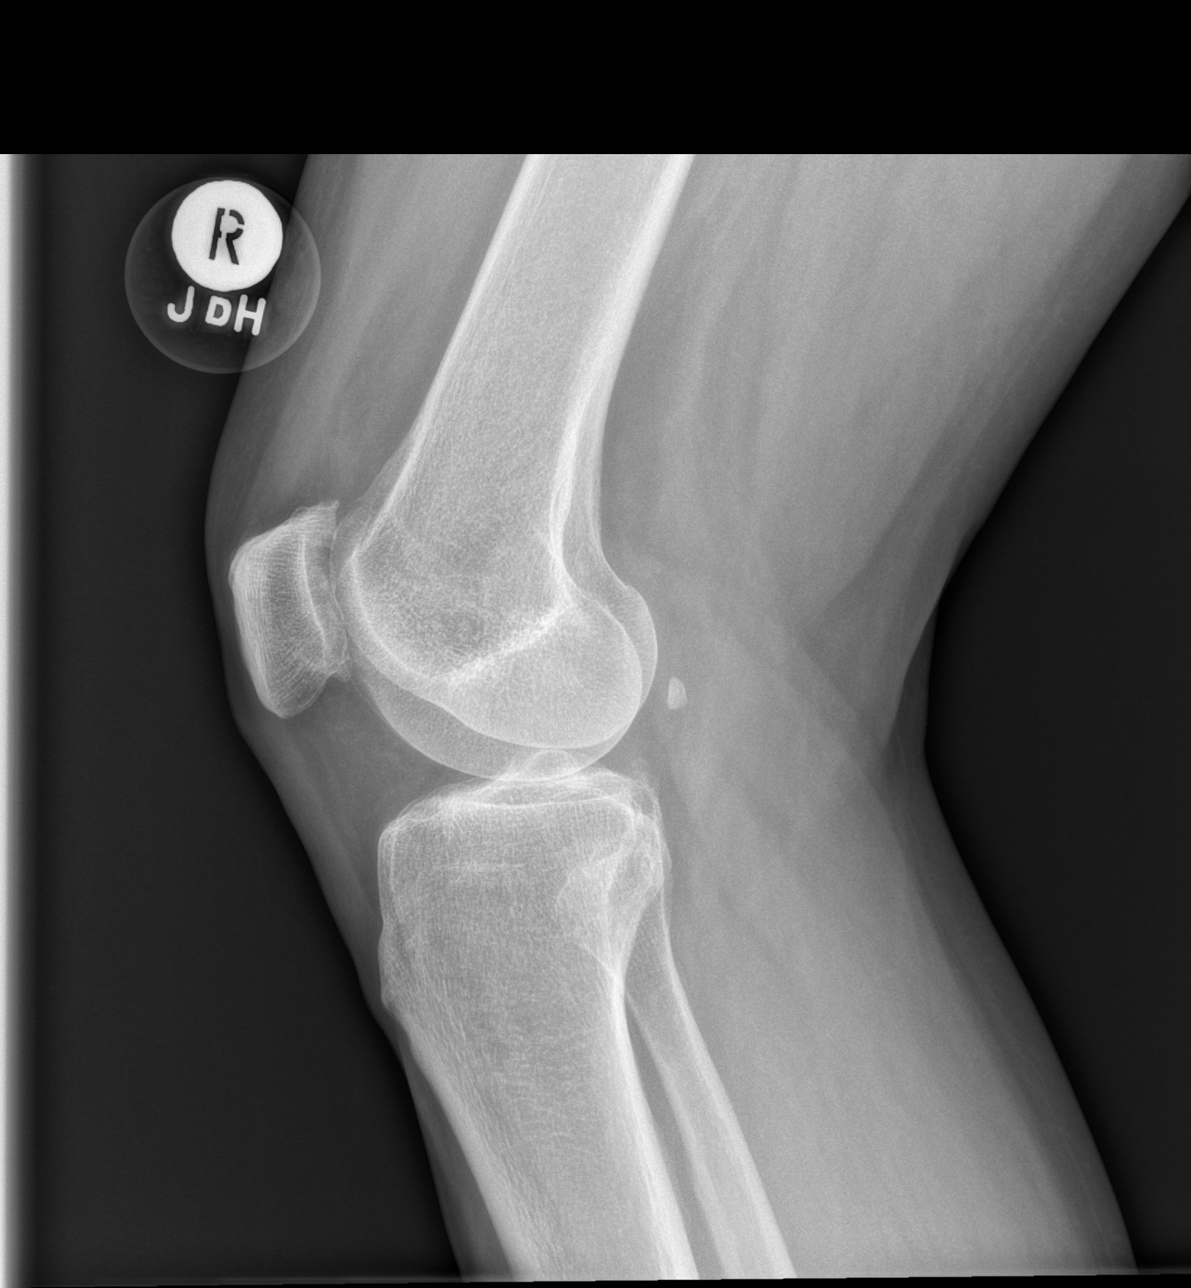

[3 of 3 positions shown; findings below may reference images not displayed]

FINDINGS: No evidence of fracture, dislocation, or joint effusion. No evidence
of arthropathy or other focal bone abnormality. Soft tissues are
unremarkable.
IMPRESSION: Negative.

## 2019-08-15 IMAGING — CR DG KNEE 3 VIEWS*L*
3 series · 3 of 3 positions shown · non-contrast
Comparison: RIGHT knee reported separately.

CLINICAL DATA: Chronic BILATERAL knee pain.

EXAM:
LEFT KNEE - 3 VIEW

[w knee ap left]
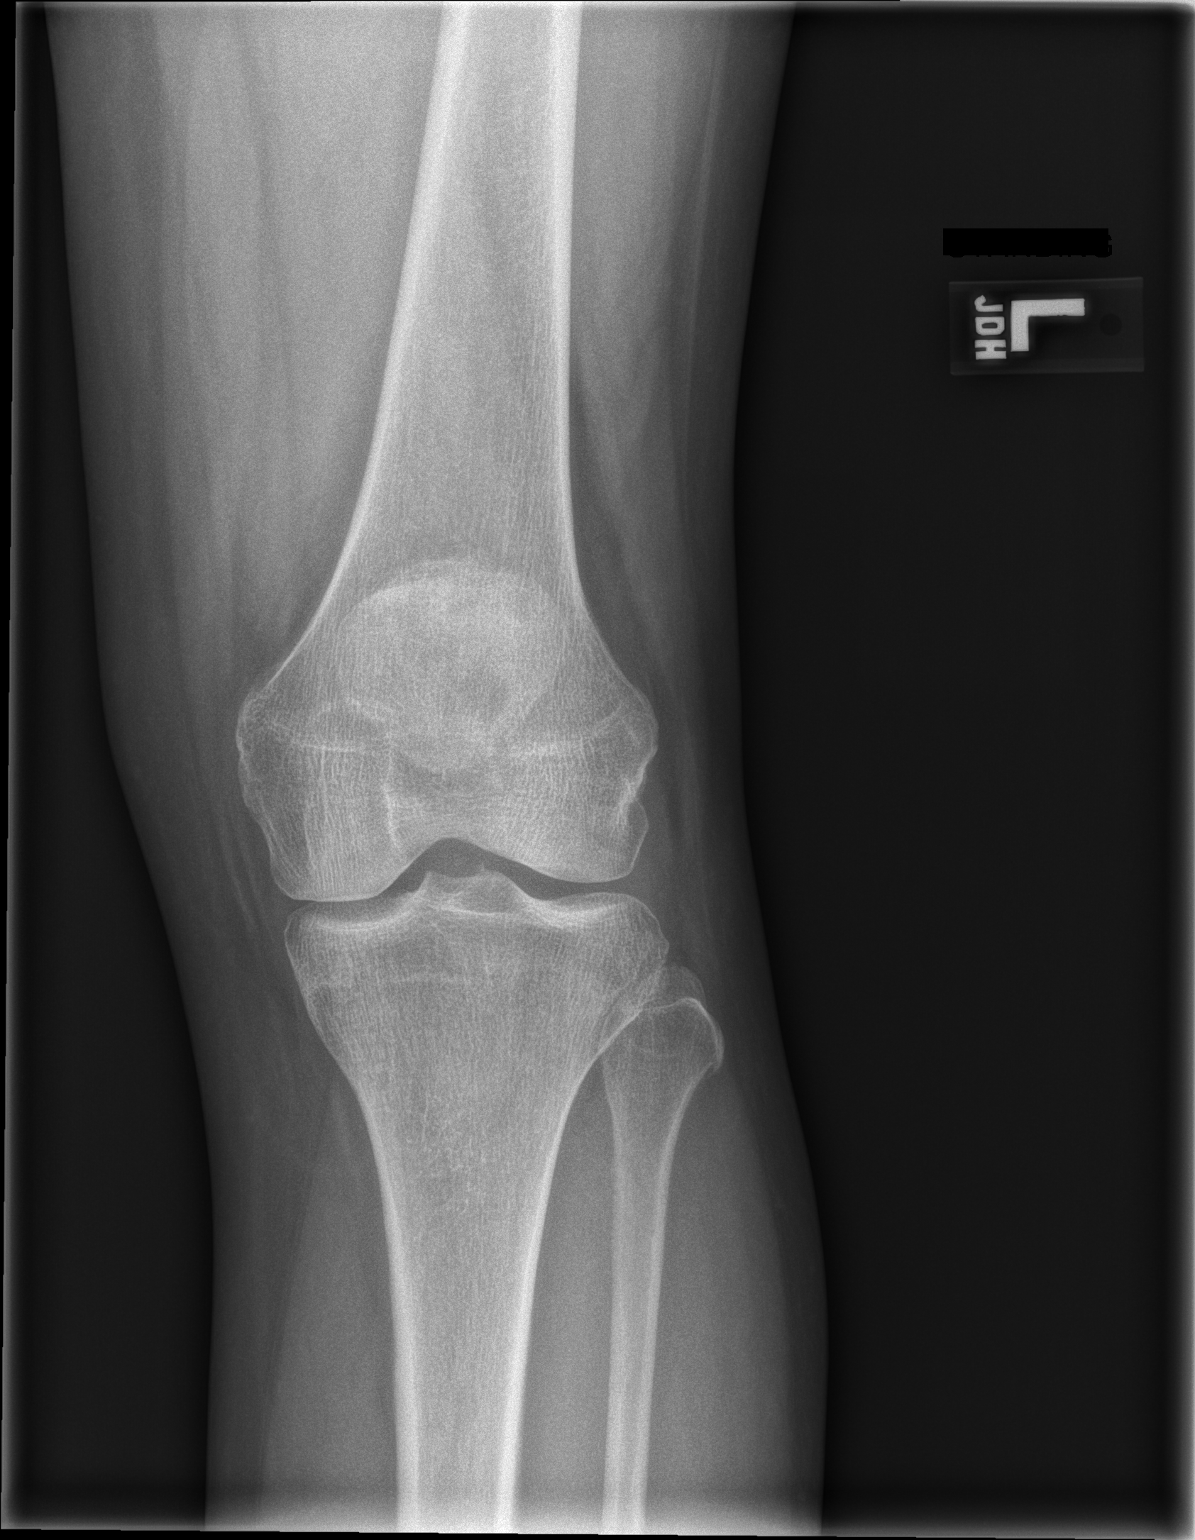

[w knee obl left]
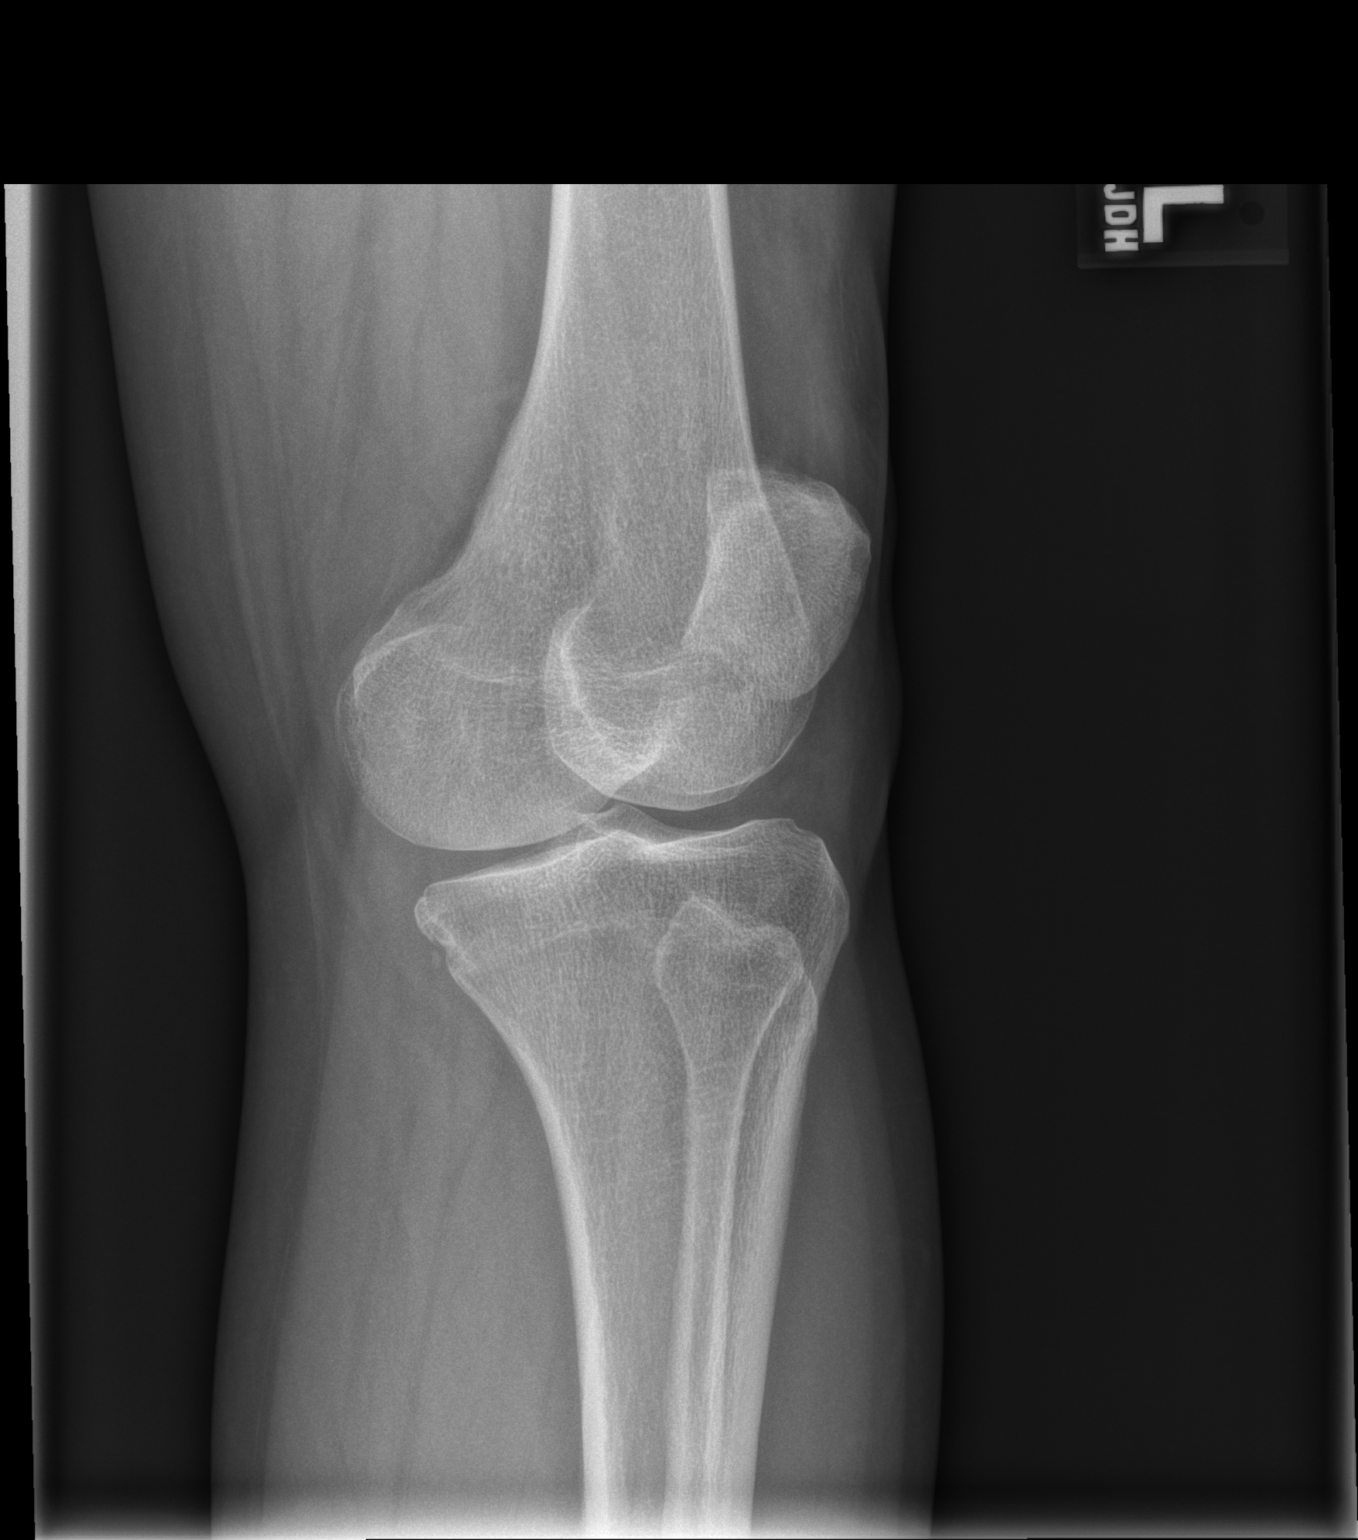

[w knee lat left]
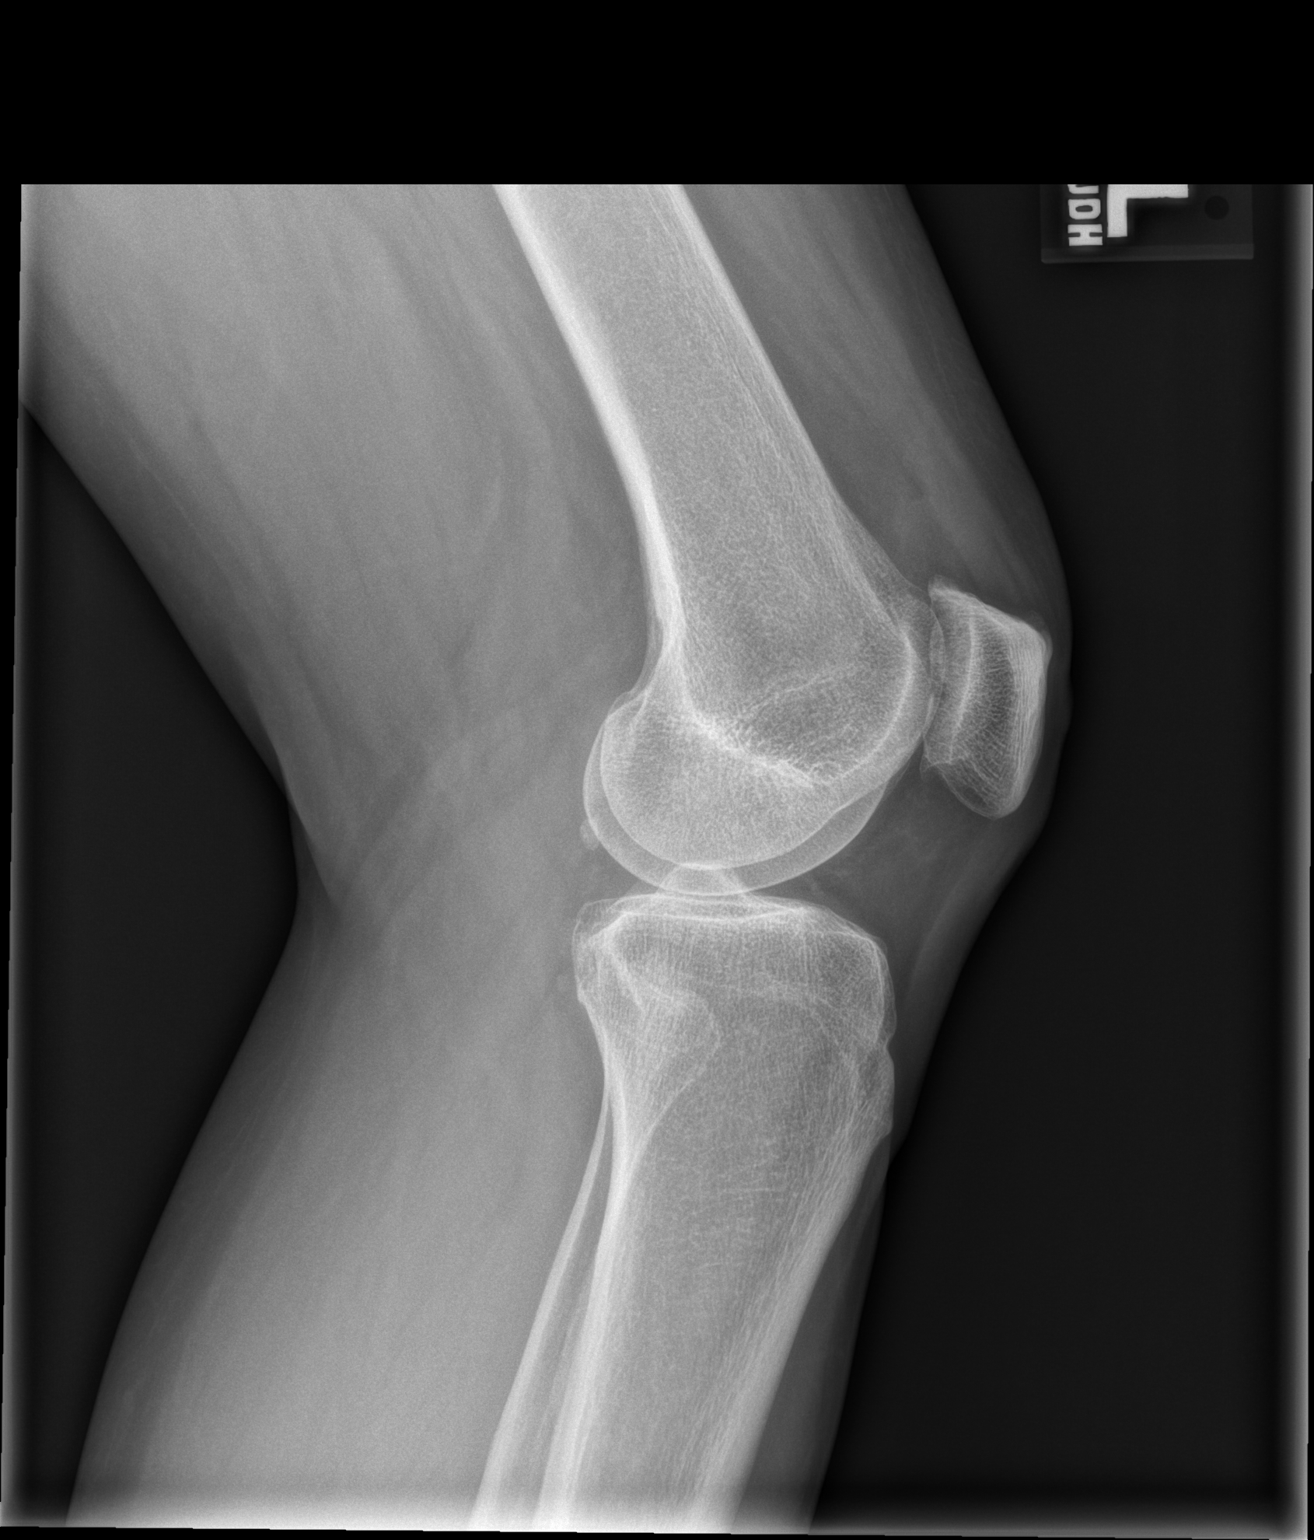

[3 of 3 positions shown; findings below may reference images not displayed]

FINDINGS: No evidence of fracture, dislocation, or joint effusion. No evidence
of arthropathy or other focal bone abnormality. Soft tissues are
unremarkable.
IMPRESSION: Negative.

## 2020-02-15 DIAGNOSIS — M65311 Trigger thumb, right thumb: Secondary | ICD-10-CM | POA: Insufficient documentation

## 2020-03-22 ENCOUNTER — Other Ambulatory Visit: Payer: Self-pay | Admitting: Physician Assistant

## 2020-03-22 DIAGNOSIS — Z1231 Encounter for screening mammogram for malignant neoplasm of breast: Secondary | ICD-10-CM

## 2020-05-17 ENCOUNTER — Ambulatory Visit
Admission: RE | Admit: 2020-05-17 | Discharge: 2020-05-17 | Disposition: A | Discharge status: Home / Self Care | Payer: BC Managed Care – PPO | Source: Ambulatory Visit | Attending: Physician Assistant | Admitting: Physician Assistant

## 2020-05-17 ENCOUNTER — Other Ambulatory Visit: Payer: Self-pay

## 2020-05-17 DIAGNOSIS — Z1231 Encounter for screening mammogram for malignant neoplasm of breast: Secondary | ICD-10-CM

## 2021-04-01 ENCOUNTER — Other Ambulatory Visit: Payer: Self-pay | Admitting: Physician Assistant

## 2021-04-01 DIAGNOSIS — Z1231 Encounter for screening mammogram for malignant neoplasm of breast: Secondary | ICD-10-CM

## 2021-05-20 ENCOUNTER — Ambulatory Visit
Admission: RE | Admit: 2021-05-20 | Discharge: 2021-05-20 | Disposition: A | Discharge status: Home / Self Care | Payer: 59 | Source: Ambulatory Visit | Attending: Physician Assistant | Admitting: Physician Assistant

## 2021-05-20 DIAGNOSIS — Z1231 Encounter for screening mammogram for malignant neoplasm of breast: Secondary | ICD-10-CM

## 2021-05-23 ENCOUNTER — Other Ambulatory Visit: Payer: Self-pay | Admitting: Physician Assistant

## 2021-05-23 DIAGNOSIS — R928 Other abnormal and inconclusive findings on diagnostic imaging of breast: Secondary | ICD-10-CM

## 2021-06-28 ENCOUNTER — Other Ambulatory Visit: Payer: Self-pay | Admitting: Physician Assistant

## 2021-06-28 ENCOUNTER — Ambulatory Visit
Admission: RE | Admit: 2021-06-28 | Discharge: 2021-06-28 | Disposition: A | Discharge status: Home / Self Care | Payer: 59 | Source: Ambulatory Visit | Attending: Physician Assistant | Admitting: Physician Assistant

## 2021-06-28 DIAGNOSIS — R921 Mammographic calcification found on diagnostic imaging of breast: Secondary | ICD-10-CM

## 2021-06-28 DIAGNOSIS — R928 Other abnormal and inconclusive findings on diagnostic imaging of breast: Secondary | ICD-10-CM

## 2021-07-10 ENCOUNTER — Ambulatory Visit
Admission: RE | Admit: 2021-07-10 | Discharge: 2021-07-10 | Disposition: A | Discharge status: Home / Self Care | Payer: 59 | Source: Ambulatory Visit | Attending: Physician Assistant | Admitting: Physician Assistant

## 2021-07-10 ENCOUNTER — Other Ambulatory Visit: Payer: Self-pay

## 2021-07-10 DIAGNOSIS — R921 Mammographic calcification found on diagnostic imaging of breast: Secondary | ICD-10-CM

## 2022-07-30 ENCOUNTER — Other Ambulatory Visit: Payer: Self-pay | Admitting: Physician Assistant

## 2022-07-30 DIAGNOSIS — Z1231 Encounter for screening mammogram for malignant neoplasm of breast: Secondary | ICD-10-CM

## 2022-08-04 ENCOUNTER — Ambulatory Visit
Admission: RE | Admit: 2022-08-04 | Discharge: 2022-08-04 | Disposition: A | Discharge status: Home / Self Care | Payer: 59 | Source: Ambulatory Visit | Attending: Physician Assistant | Admitting: Physician Assistant

## 2022-08-04 DIAGNOSIS — Z1231 Encounter for screening mammogram for malignant neoplasm of breast: Secondary | ICD-10-CM

## 2022-11-11 ENCOUNTER — Ambulatory Visit
Admission: RE | Admit: 2022-11-11 | Discharge: 2022-11-11 | Disposition: A | Discharge status: Home / Self Care | Payer: 59 | Source: Ambulatory Visit | Attending: Family Medicine | Admitting: Family Medicine

## 2022-11-11 ENCOUNTER — Ambulatory Visit (INDEPENDENT_AMBULATORY_CARE_PROVIDER_SITE_OTHER): Payer: 59

## 2022-11-11 VITALS — BP 129/87 | HR 63 | Temp 97.4°F | Resp 16

## 2022-11-11 DIAGNOSIS — M5432 Sciatica, left side: Secondary | ICD-10-CM

## 2022-11-11 DIAGNOSIS — S39012A Strain of muscle, fascia and tendon of lower back, initial encounter: Secondary | ICD-10-CM

## 2022-11-11 DIAGNOSIS — M5431 Sciatica, right side: Secondary | ICD-10-CM | POA: Diagnosis not present

## 2022-11-11 DIAGNOSIS — M545 Low back pain, unspecified: Secondary | ICD-10-CM | POA: Diagnosis not present

## 2022-11-11 MED ORDER — METHOCARBAMOL 500 MG PO TABS: 500.0000 mg | ORAL_TABLET | Freq: Three times a day (TID) | ORAL | 0 refills | Status: DC | PRN

## 2022-11-11 MED ORDER — PREDNISONE 10 MG (21) PO TBPK: ORAL_TABLET | Freq: Every day | ORAL | 0 refills | Status: DC

## 2022-11-11 NOTE — ED Triage Notes (Signed)
[facility] presents to [facility] with [facility] of mid lower back pain. Symptoms started Saturday. Pt has worsening of pain with movement.  She reports she thinks she may have hurt it gardening. Pt has taken tylenol.

## 2022-11-11 NOTE — Discharge Instructions (Addendum)
Advised patient to take medication (Sterapred Unipak) as directed with food to completion. Advised may use Robaxin daily or as needed for accompanying muscle spasms of lower back.  Encouraged increase daily water intake to 64 ounces per day while taking these medications.  Encouraged patient to avoid offending activities of lower back for the next 7 to 10 days.  Advised if symptoms worsen and/or unresolved please follow-up with PCP, back specialist or here for further evaluation.  Contact information for back specialist provided on this AVS.

## 2022-11-11 NOTE — ED Provider Notes (Signed)
[facility]    CSN: [id number] Arrival date & time: 11/11/22  1478      History   Chief Complaint Chief Complaint  Patient presents with   Back Pain    Lower back pain radiating down the legs. Started on Saturday morning and has been some what improving since. - Entered by patient    HPI Valerie Jenkins is a 62 y.o. female.   HPI Pleasant 62 year old female presents with mid lower back pain that began 3 days ago on Saturday, 11/08/2022.  Patient reports increased back pain with movement.  Patient believes she may have injured this area while gardening.  Patient is accompanied by her husband this morning.  PMH significant for obesity, HTN, and HLD.  Past Medical History:  Diagnosis Date   Arthritis    GERD (gastroesophageal reflux disease)    Hyperlipidemia    Hypertension    Seasonal allergies     Patient Active Problem List   Diagnosis Date Noted   Adhesive capsulitis of left shoulder 01/18/2019   BMI 29.0-29.9,adult 07/28/2015   HTN (hypertension) 04/14/2012   Hyperlipidemia 04/14/2012   AR (allergic rhinitis) 04/14/2012   GERD (gastroesophageal reflux disease) 04/14/2012   Osteoarthritis of both knees 04/14/2012   Vitamin deficiency 04/14/2012    Past Surgical History:  Procedure Laterality Date   bartholin cystectomy     BARTHOLIN GLAND CYST EXCISION     CERVICAL POLYPECTOMY  1/84   CESAREAN SECTION  1996    OB History     Gravida  1   Para  1   Term  1   Preterm      AB      Living  1      SAB      IAB      Ectopic      Multiple      Live Births  1            Home Medications    Prior to Admission medications   Medication Sig Start Date End Date Taking? Authorizing Provider  celecoxib (CELEBREX) 200 MG capsule Take 200 mg by mouth daily.   Yes [provider]  methocarbamol (ROBAXIN) 500 MG tablet Take 1 tablet (500 mg total) by mouth 3 (three) times daily as needed. 11/11/22  Yes Trevor Iha, FNP   predniSONE (STERAPRED UNI-PAK 21 TAB) 10 MG (21) TBPK tablet Take by mouth daily. Take 6 tabs by mouth daily  for 2 days, then 5 tabs for 2 days, then 4 tabs for 2 days, then 3 tabs for 2 days, 2 tabs for 2 days, then 1 tab by mouth daily for 2 days 11/11/22  Yes Trevor Iha, FNP  Vitamin D, Ergocalciferol, (DRISDOL) 1.25 MG (50000 UNIT) CAPS capsule Take 50,000 Units by mouth once a week. 10/04/22  Yes [provider]  aspirin 81 MG chewable tablet Chew by mouth.    [provider]  atorvastatin (LIPITOR) 10 MG tablet Take 1 tablet (10 mg total) by mouth daily. 07/28/15   Tonye Pearson, MD  azelastine (OPTIVAR) 0.05 % ophthalmic solution Place 1 drop into both eyes 2 (two) times daily. 07/28/15   Tonye Pearson, MD  EPINEPHrine (EPIPEN JR 2-PAK) 0.15 MG/0.3ML injection Inject 0.3 mLs (0.15 mg total) into the muscle as needed for anaphylaxis. 04/27/18   Rodriguez-Southworth, Nettie Elm, PA-C  fluticasone (FLONASE) 50 MCG/ACT nasal spray USE 2 SPRAYS IN [facility] NOSTRIL DAILY 04/22/18   Rodriguez-Southworth, Nettie Elm, PA-C  losartan-hydrochlorothiazide (  HYZAAR) 100-25 MG tablet TAKE 1 TABLET BY MOUTH EVERY DAY 01/04/19   Rodriguez-Southworth, Nettie Elm, PA-C  montelukast (SINGULAIR) 10 MG tablet TAKE ONE TABLET BY MOUTH NIGHTLY AT BEDTIME 07/28/15   Tonye Pearson, MD  omeprazole (PRILOSEC) 40 MG capsule TAKE 1 CAPSULE (40 MG TOTAL) BY MOUTH DAILY. 07/09/18   Dorothyann Peng, MD    Family History Family History  Problem Relation Age of Onset   Diabetes Mother    Hypertension Mother    Hyperlipidemia Mother    Osteoporosis Mother    Diabetes Father    Hypertension Father    Hyperlipidemia Father    Stroke Father    Hypertension Sister    Arthritis Paternal Grandmother     Social History Social History   Tobacco Use   Smoking status: Never   Smokeless tobacco: Never  Vaping Use   Vaping Use: Never used  Substance Use Topics   Alcohol use: Yes    Alcohol/week: 1.0  standard drink of alcohol    Types: 1 Standard drinks or equivalent per week   Drug use: No     Allergies   Cyclobenzaprine, Flexeril [cyclobenzaprine hcl], Peanut oil, Peanut-containing drug products, and Shellfish allergy   Review of Systems Review of Systems  Musculoskeletal:  Positive for back pain.     Physical Exam Triage Vital Signs ED Triage Vitals  Enc Vitals Group     BP 11/11/22 0949 129/87     Pulse Rate 11/11/22 0949 63     Resp 11/11/22 0949 16     Temp 11/11/22 0949 (!) 97.4 F (36.3 C)     Temp src --      SpO2 11/11/22 0949 98 %     Weight --      Height --      Head Circumference --      Peak Flow --      Pain Score 11/11/22 0947 5     Pain Loc --      Pain Edu? --      Excl. in GC? --    No data found.  Updated Vital Signs BP 129/87   Pulse 63   Temp (!) 97.4 F (36.3 C)   Resp 16   LMP 04/16/2016   SpO2 98%    Physical Exam Vitals and nursing note reviewed.  Constitutional:      Appearance: Normal appearance. She is normal weight.  HENT:     Head: Normocephalic and atraumatic.     Mouth/Throat:     Mouth: Mucous membranes are moist.     Pharynx: Oropharynx is clear.  Eyes:     Extraocular Movements: Extraocular movements intact.     Conjunctiva/sclera: Conjunctivae normal.     Pupils: Pupils are equal, round, and reactive to light.  Cardiovascular:     Rate and Rhythm: Normal rate and regular rhythm.     Pulses: Normal pulses.     Heart sounds: Normal heart sounds.  Pulmonary:     Effort: Pulmonary effort is normal.     Breath sounds: Normal breath sounds. No wheezing, rhonchi or rales.  Musculoskeletal:        General: Normal range of motion.     Cervical back: Normal range of motion and neck supple.     Comments: Lumbar spine (central inferior aspect): TTP over spinous processes, parous spinous, superior spinal erectors muscles bilaterally, no deformity noted-patient reporting dull radiating pain from this area over  bilateral buttocks hips anterior/posterior upper legs  Skin:    General: Skin is warm and dry.  Neurological:     General: No focal deficit present.     Mental Status: She is alert and oriented to person, place, and time. Mental status is at baseline.      UC Treatments / Results  Labs (all labs ordered are listed, but only abnormal results are displayed) Labs Reviewed - No data to display  EKG   Radiology DG Lumbar Spine Complete  Result Date: 11/11/2022 CLINICAL DATA:  Back pain. EXAM: LUMBAR SPINE - COMPLETE 4+ VIEW COMPARISON:  None Available. FINDINGS: Normal alignment of the lumbar vertebral bodies. Disc spaces and vertebral bodies are maintained. No acute bony findings or destructive bony changes. Mild lumbar facet disease but no pars defects. The visualized bony pelvis is intact. Both hips are normally located. The SI joints are maintained. Scattered aortic calcifications are noted. Calcified uterine fibroid. IMPRESSION: 1. Normal alignment and no acute bony findings. 2. Mild lumbar facet disease. Electronically Signed   By: Rudie Meyer M.D.   On: 11/11/2022 11:49    Procedures Procedures (including critical care time)  Medications Ordered in UC Medications - No data to display  Initial Impression / Assessment and Plan / UC Course  I have reviewed the triage vital signs and the nursing notes.  Pertinent labs & imaging results that were available during my care of the patient were reviewed by me and considered in my medical decision making (see chart for details).     MDM: 1.  Bilateral sciatica-Rx'd Sterapred Unipak (tapering from 60 mg to 10 mg over 10 days; 2.  Strain of lumbar region, initial encounter-Rx'd Robaxin 500 mg 3 times daily, as needed. Advised patient to take medication (Sterapred Unipak) as directed with food to completion. Advised may use Robaxin daily or as needed for accompanying muscle spasms of lower back.  Encouraged increase daily water intake to  64 ounces per day while taking these medications.  Encouraged patient to avoid offending activities of lower back for the next 7 to 10 days.  Advised if symptoms worsen and/or unresolved please follow-up with PCP, back specialist or here for further evaluation.  Contact information for back specialist provided on this AVS. patient discharged home hemodynamically stable. Final Clinical Impressions(s) / UC Diagnoses   Final diagnoses:  Strain of lumbar region, initial encounter  Bilateral sciatica     Discharge Instructions      Advised patient to take medication (Sterapred Unipak) as directed with food to completion. Advised may use Robaxin daily or as needed for accompanying muscle spasms of lower back.  Encouraged increase daily water intake to 64 ounces per day while taking these medications.  Encouraged patient to avoid offending activities of lower back for the next 7 to 10 days.  Advised if symptoms worsen and/or unresolved please follow-up with PCP, back specialist or here for further evaluation.  Contact information for back specialist provided on this AVS.     ED Prescriptions     Medication Sig Dispense Auth. Provider   predniSONE (STERAPRED UNI-PAK 21 TAB) 10 MG (21) TBPK tablet Take by mouth daily. Take 6 tabs by mouth daily  for 2 days, then 5 tabs for 2 days, then 4 tabs for 2 days, then 3 tabs for 2 days, 2 tabs for 2 days, then 1 tab by mouth daily for 2 days 42 tablet Trevor Iha, FNP   methocarbamol (ROBAXIN) 500 MG tablet Take 1 tablet (500 mg total) by mouth 3 (three)  times daily as needed. 30 tablet Trevor Iha, FNP      PDMP not reviewed this encounter.   Trevor Iha, FNP 11/11/22 1215

## 2022-12-13 ENCOUNTER — Other Ambulatory Visit: Payer: Self-pay

## 2022-12-13 ENCOUNTER — Ambulatory Visit
Admission: RE | Admit: 2022-12-13 | Discharge: 2022-12-13 | Disposition: A | Discharge status: Home / Self Care | Payer: 59 | Source: Ambulatory Visit | Attending: Family Medicine | Admitting: Family Medicine

## 2022-12-13 VITALS — BP 148/80 | HR 60 | Temp 98.5°F | Resp 17

## 2022-12-13 DIAGNOSIS — U071 COVID-19: Secondary | ICD-10-CM

## 2022-12-13 DIAGNOSIS — R059 Cough, unspecified: Secondary | ICD-10-CM

## 2022-12-13 LAB — POC SARS CORONAVIRUS 2 AG -  ED: SARS Coronavirus 2 Ag: POSITIVE — AB

## 2022-12-13 MED ORDER — BENZONATATE 200 MG PO CAPS: 200.0000 mg | ORAL_CAPSULE | Freq: Three times a day (TID) | ORAL | 0 refills | Status: AC | PRN

## 2022-12-13 MED ORDER — PAXLOVID (300/100) 20 X 150 MG & 10 X 100MG PO TBPK: 3.0000 | ORAL_TABLET | Freq: Two times a day (BID) | ORAL | 0 refills | Status: AC

## 2022-12-13 NOTE — ED Provider Notes (Signed)
[facility]    CSN: [id number] Arrival date & time: 12/13/22  1023      History   Chief Complaint Chief Complaint  Patient presents with   Cough    APPT 1030AM   Nasal Congestion    HPI Valerie Jenkins is a 62 y.o. female.   HPI Pleasant 62 year old female presents with cough and runny nose for 3 days.  Reports using OTC nasal spray.  PMH significant for obesity, HTN, and allergic rhinitis.  Patient request COVID-19 test.  Patient is accompanied by her husband this morning.  Past Medical History:  Diagnosis Date   Arthritis    GERD (gastroesophageal reflux disease)    Hyperlipidemia    Hypertension    Seasonal allergies     Patient Active Problem List   Diagnosis Date Noted   Adhesive capsulitis of left shoulder 01/18/2019   BMI 29.0-29.9,adult 07/28/2015   HTN (hypertension) 04/14/2012   Hyperlipidemia 04/14/2012   AR (allergic rhinitis) 04/14/2012   GERD (gastroesophageal reflux disease) 04/14/2012   Osteoarthritis of both knees 04/14/2012   Vitamin deficiency 04/14/2012    Past Surgical History:  Procedure Laterality Date   bartholin cystectomy     BARTHOLIN GLAND CYST EXCISION     CERVICAL POLYPECTOMY  1/84   CESAREAN SECTION  1996    OB History     Gravida  1   Para  1   Term  1   Preterm      AB      Living  1      SAB      IAB      Ectopic      Multiple      Live Births  1            Home Medications    Prior to Admission medications   Medication Sig Start Date End Date Taking? Authorizing Provider  benzonatate (TESSALON) 200 MG capsule Take 1 capsule (200 mg total) by mouth 3 (three) times daily as needed for up to 7 days. 12/13/22 12/20/22 Yes Trevor Iha, FNP  nirmatrelvir & ritonavir (PAXLOVID, 300/100,) 20 x 150 MG & 10 x 100MG  TBPK Take 3 tablets by mouth 2 (two) times daily for 5 days. 12/13/22 12/18/22 Yes Trevor Iha, FNP  aspirin 81 MG chewable tablet Chew by mouth.    [provider]   atorvastatin (LIPITOR) 10 MG tablet Take 1 tablet (10 mg total) by mouth daily. 07/28/15   Tonye Pearson, MD  azelastine (OPTIVAR) 0.05 % ophthalmic solution Place 1 drop into both eyes 2 (two) times daily. 07/28/15   Tonye Pearson, MD  celecoxib (CELEBREX) 200 MG capsule Take 200 mg by mouth daily.    [provider]  EPINEPHrine (EPIPEN JR 2-PAK) 0.15 MG/0.3ML injection Inject 0.3 mLs (0.15 mg total) into the muscle as needed for anaphylaxis. 04/27/18   Rodriguez-Southworth, Nettie Elm, PA-C  fluticasone (FLONASE) 50 MCG/ACT nasal spray USE 2 SPRAYS IN EACH NOSTRIL DAILY 04/22/18   Rodriguez-Southworth, Nettie Elm, PA-C  losartan-hydrochlorothiazide (HYZAAR) 100-25 MG tablet TAKE 1 TABLET BY MOUTH EVERY DAY 01/04/19   Rodriguez-Southworth, Nettie Elm, PA-C  montelukast (SINGULAIR) 10 MG tablet TAKE ONE TABLET BY MOUTH NIGHTLY AT BEDTIME 07/28/15   Tonye Pearson, MD  omeprazole (PRILOSEC) 40 MG capsule TAKE 1 CAPSULE (40 MG TOTAL) BY MOUTH DAILY. 07/09/18   Dorothyann Peng, MD  Vitamin D, Ergocalciferol, (DRISDOL) 1.25 MG (50000 UNIT) CAPS capsule Take 50,000 Units by mouth once a week. 10/04/22  [provider]    Family History Family History  Problem Relation Age of Onset   Diabetes Mother    Hypertension Mother    Hyperlipidemia Mother    Osteoporosis Mother    Diabetes Father    Hypertension Father    Hyperlipidemia Father    Stroke Father    Hypertension Sister    Arthritis Paternal Grandmother     Social History Social History   Tobacco Use   Smoking status: Never   Smokeless tobacco: Never  Vaping Use   Vaping status: Never Used  Substance Use Topics   Alcohol use: Yes    Alcohol/week: 1.0 standard drink of alcohol    Types: 1 Standard drinks or equivalent per week   Drug use: No     Allergies   Cyclobenzaprine, Flexeril [cyclobenzaprine hcl], Peanut oil, Peanut-containing drug products, and Shellfish allergy   Review of Systems Review of  Systems  HENT:  Positive for congestion and rhinorrhea.   Respiratory:  Positive for cough.   All other systems reviewed and are negative.    Physical Exam Triage Vital Signs ED Triage Vitals [12/13/22 1032]  Encounter Vitals Group     BP (!) 148/80     Systolic BP Percentile      Diastolic BP Percentile      Pulse Rate 60     Resp 17     Temp 98.5 F (36.9 C)     Temp Source Oral     SpO2 97 %     Weight      Height      Head Circumference      Peak Flow      Pain Score 0     Pain Loc      Pain Education      Exclude from Growth Chart    No data found.  Updated Vital Signs BP (!) 148/80 (BP Location: Right Arm)   Pulse 60   Temp 98.5 F (36.9 C) (Oral)   Resp 17   LMP 04/16/2016   SpO2 97%       Physical Exam Vitals and nursing note reviewed.  Constitutional:      Appearance: She is obese.  HENT:     Head: Normocephalic and atraumatic.     Right Ear: External ear normal.     Left Ear: External ear normal.     Mouth/Throat:     Mouth: Mucous membranes are moist.     Pharynx: Oropharynx is clear.  Eyes:     Extraocular Movements: Extraocular movements intact.     Conjunctiva/sclera: Conjunctivae normal.     Pupils: Pupils are equal, round, and reactive to light.  Cardiovascular:     Rate and Rhythm: Normal rate and regular rhythm.     Pulses: Normal pulses.     Heart sounds: Normal heart sounds.  Pulmonary:     Effort: Pulmonary effort is normal.     Breath sounds: Normal breath sounds. No wheezing, rhonchi or rales.  Musculoskeletal:        General: Normal range of motion.     Cervical back: Normal range of motion and neck supple.  Skin:    General: Skin is warm and dry.  Neurological:     General: No focal deficit present.     Mental Status: She is alert and oriented to person, place, and time.  Psychiatric:        Mood and Affect: Mood normal.  Behavior: Behavior normal.      UC Treatments / Results  Labs (all labs ordered are  listed, but only abnormal results are displayed) Labs Reviewed  POC SARS CORONAVIRUS 2 AG -  ED - Abnormal; Notable for the following components:      Result Value   SARS Coronavirus 2 Ag Positive (*)    All other components within normal limits    EKG   Radiology No results found.  Procedures Procedures (including critical care time)  Medications Ordered in UC Medications - No data to display  Initial Impression / Assessment and Plan / UC Course  I have reviewed the triage vital signs and the nursing notes.  Pertinent labs & imaging results that were available during my care of the patient were reviewed by me and considered in my medical decision making (see chart for details).     MDM: 1.  COVID-19-Rx'd Paxlovid (300/100) 20 x 150 mg & 10 x 100mg  TBPK; 2.  Cough, unspecified type-Rx'd Tessalon Perles 200 mg 3 times daily, as needed. Advised patient of positive COVID-19 results.  Advised patient to take medications as directed with food to completion.  Advised may take Tessalon Perles daily or as needed for cough.  Encouraged increase daily water intake to 64 ounces per day while taking these medications.  Advised symptoms worsen and/or unresolved please follow-up with PCP or here for further evaluation.  Patient discharged home, hemodynamically stable Final Clinical Impressions(s) / UC Diagnoses   Final diagnoses:  Cough, unspecified type  COVID-19     Discharge Instructions      Advised patient of positive COVID-19 results.  Advised patient to take medications as directed with food to completion.  Advised may take Tessalon Perles daily or as needed for cough.  Encouraged increase daily water intake to 64 ounces per day while taking these medications.  Advised symptoms worsen and/or unresolved please follow-up with PCP or here for further evaluation.     ED Prescriptions     Medication Sig Dispense Auth. Provider   benzonatate (TESSALON) 200 MG capsule Take 1 capsule  (200 mg total) by mouth 3 (three) times daily as needed for up to 7 days. 40 capsule Trevor Iha, FNP   nirmatrelvir & ritonavir (PAXLOVID, 300/100,) 20 x 150 MG & 10 x 100MG  TBPK Take 3 tablets by mouth 2 (two) times daily for 5 days. 30 tablet Trevor Iha, FNP      PDMP not reviewed this encounter.   Trevor Iha, FNP 12/13/22 1134

## 2022-12-13 NOTE — ED Triage Notes (Signed)
Pt c/o cough and runny nose since Wed night. Denies fever. Using nasal spray prn.

## 2022-12-13 NOTE — Discharge Instructions (Addendum)
Advised patient of positive COVID-19 results.  Advised patient to take medications as directed with food to completion.  Advised may take Tessalon Perles daily or as needed for cough.  Encouraged increase daily water intake to 64 ounces per day while taking these medications.  Advised symptoms worsen and/or unresolved please follow-up with PCP or here for further evaluation.

## 2023-05-08 ENCOUNTER — Telehealth: Payer: Self-pay | Admitting: *Deleted

## 2023-05-08 NOTE — Telephone Encounter (Signed)
Left patient a message to call and schedule New PHYSICAL with Dr. Penne Lash for 05/11/2023 at 9:10 AM with an 8:55 AM arrival if she is able.

## 2023-05-11 ENCOUNTER — Encounter: Payer: Self-pay | Admitting: Obstetrics & Gynecology

## 2023-05-11 ENCOUNTER — Other Ambulatory Visit (HOSPITAL_COMMUNITY)
Admission: RE | Admit: 2023-05-11 | Discharge: 2023-05-11 | Disposition: A | Discharge status: Home / Self Care | Payer: Managed Care, Other (non HMO) | Source: Ambulatory Visit | Attending: Obstetrics & Gynecology | Admitting: Obstetrics & Gynecology

## 2023-05-11 ENCOUNTER — Ambulatory Visit (INDEPENDENT_AMBULATORY_CARE_PROVIDER_SITE_OTHER): Payer: Managed Care, Other (non HMO) | Admitting: Obstetrics & Gynecology

## 2023-05-11 VITALS — BP 162/92 | HR 54 | Ht 65.0 in | Wt 172.0 lb

## 2023-05-11 DIAGNOSIS — N951 Menopausal and female climacteric states: Secondary | ICD-10-CM | POA: Diagnosis not present

## 2023-05-11 DIAGNOSIS — E559 Vitamin D deficiency, unspecified: Secondary | ICD-10-CM

## 2023-05-11 DIAGNOSIS — Z01419 Encounter for gynecological examination (general) (routine) without abnormal findings: Secondary | ICD-10-CM | POA: Diagnosis present

## 2023-05-11 DIAGNOSIS — R5382 Chronic fatigue, unspecified: Secondary | ICD-10-CM | POA: Diagnosis not present

## 2023-05-11 DIAGNOSIS — Z1151 Encounter for screening for human papillomavirus (HPV): Secondary | ICD-10-CM

## 2023-05-11 NOTE — Progress Notes (Signed)
  Subjective:     Valerie Jenkins is a 62 y.o. female here for a routine exam.  Current complaints: fatigue, joints aching.  Hot flashes. Menopause at 56--hot flashes began then.     Gynecologic History Patient's last menstrual period was 04/16/2016. Contraception: post menopausal status Last Mammogram: 08/06/22- negative Last Pap Smear:  03/23/2019- negative Last Colon Screening;  2021 Seat Belts:   yes Sun Screen:   yes Dental Check Up:  yes Brush & Floss:  yes   Obstetric History OB History  Gravida Para Term Preterm AB Living  1 1 1     1   SAB IAB Ectopic Multiple Live Births          1    # Outcome Date GA Lbr Len/2nd Weight Sex Type Anes PTL Lv  1 Term 06/1994    F CS-Unspec   LIV     The following portions of the patient's history were reviewed and updated as appropriate: allergies, current medications, past family history, past medical history, past social history, past surgical history, and problem list.  Review of Systems Pertinent items noted in HPI and remainder of comprehensive ROS otherwise negative.    Objective:     Vitals:   05/11/23 0845  BP: (!) 152/84  Pulse: 61  Weight: 172 lb (78 kg)  Height: 5\' 5"  (1.651 m)   Vitals:  WNL General appearance: alert, cooperative and no distress  HEENT: Normocephalic, without obvious abnormality, atraumatic Eyes: negative Throat: lips, mucosa, and tongue normal; teeth and gums normal  Respiratory: Clear to auscultation bilaterally  CV: Regular rate and rhythm  Breasts:  Normal appearance, no masses or tenderness, no nipple retraction or dimpling  GI: Soft, non-tender; bowel sounds normal; no masses,  no organomegaly  GU: External Genitalia:  Tanner V, no lesion Urethra:  No prolapse   Vagina: Pink, normal rugae, no blood or discharge  Cervix: No CMT, no lesion  Uterus:  Normal size and contour, non tender  Adnexa: Normal, no masses, non tender  Musculoskeletal: No edema, redness or tenderness in the calves  or thighs  Skin: No lesions or rash  Lymphatic: Axillary adenopathy: none     Psychiatric: Normal mood and behavior        Assessment:    Healthy female exam.    Plan:   Pap with cotesting Yearly mammograms Up to date with colon screening. Yearly labs and wants move PCP to [facility]--referral given to FP either in this building or [facility] [employer].  Fatigue--check cbc, tsh Vitamin d def--check vit D Information given to patient on hormonal and non hormonal medications for hot flashes.

## 2023-05-13 LAB — CBC
Hematocrit: 40.1 % (ref 34.0–46.6)
Hemoglobin: 13.5 g/dL (ref 11.1–15.9)
MCH: 27.4 pg (ref 26.6–33.0)
MCHC: 33.7 g/dL (ref 31.5–35.7)
MCV: 82 fL (ref 79–97)
Platelets: 286 10*3/uL (ref 150–450)
RBC: 4.92 x10E6/uL (ref 3.77–5.28)
RDW: 13.9 % (ref 11.7–15.4)
WBC: 5.9 10*3/uL (ref 3.4–10.8)

## 2023-05-13 LAB — COMPREHENSIVE METABOLIC PANEL
ALT: 12 [IU]/L (ref 0–32)
AST: 17 [IU]/L (ref 0–40)
Albumin: 4.5 g/dL (ref 3.9–4.9)
Alkaline Phosphatase: 108 [IU]/L (ref 44–121)
BUN/Creatinine Ratio: 12 (ref 12–28)
BUN: 10 mg/dL (ref 8–27)
Bilirubin Total: 0.5 mg/dL (ref 0.0–1.2)
CO2: 25 mmol/L (ref 20–29)
Calcium: 9.8 mg/dL (ref 8.7–10.3)
Chloride: 101 mmol/L (ref 96–106)
Creatinine, Ser: 0.83 mg/dL (ref 0.57–1.00)
Globulin, Total: 2.1 g/dL (ref 1.5–4.5)
Glucose: 115 mg/dL — ABNORMAL HIGH (ref 70–99)
Potassium: 4.2 mmol/L (ref 3.5–5.2)
Sodium: 141 mmol/L (ref 134–144)
Total Protein: 6.6 g/dL (ref 6.0–8.5)
eGFR: 79 mL/min/{1.73_m2} (ref >59)

## 2023-05-13 LAB — LIPID PANEL
Chol/HDL Ratio: 3.5 {ratio} (ref 0.0–4.4)
Cholesterol, Total: 215 mg/dL — ABNORMAL HIGH (ref 100–199)
HDL: 61 mg/dL (ref >39)
LDL Chol Calc (NIH): 131 mg/dL — ABNORMAL HIGH (ref 0–99)
Triglycerides: 130 mg/dL (ref 0–149)
VLDL Cholesterol Cal: 23 mg/dL (ref 5–40)

## 2023-05-13 LAB — CYTOLOGY - PAP
Comment: NEGATIVE
Diagnosis: UNDETERMINED — AB
High risk HPV: NEGATIVE

## 2023-05-13 LAB — TSH: TSH: 1.75 u[IU]/mL (ref 0.450–4.500)

## 2023-05-13 LAB — HEMOGLOBIN A1C
Est. average glucose Bld gHb Est-mCnc: 134 mg/dL
Hgb A1c MFr Bld: 6.3 % — ABNORMAL HIGH (ref 4.8–5.6)

## 2023-05-13 LAB — VITAMIN D 25 HYDROXY (VIT D DEFICIENCY, FRACTURES): Vit D, 25-Hydroxy: 94.6 ng/mL (ref 30.0–100.0)

## 2023-05-18 ENCOUNTER — Encounter: Payer: Self-pay | Admitting: Family Medicine

## 2023-05-18 ENCOUNTER — Ambulatory Visit: Payer: Managed Care, Other (non HMO) | Admitting: Family Medicine

## 2023-05-18 VITALS — BP 146/72 | HR 57 | Temp 97.5°F | Resp 18 | Ht 65.0 in | Wt 166.4 lb

## 2023-05-18 DIAGNOSIS — K219 Gastro-esophageal reflux disease without esophagitis: Secondary | ICD-10-CM | POA: Diagnosis not present

## 2023-05-18 DIAGNOSIS — M159 Polyosteoarthritis, unspecified: Secondary | ICD-10-CM

## 2023-05-18 DIAGNOSIS — Z818 Family history of other mental and behavioral disorders: Secondary | ICD-10-CM

## 2023-05-18 DIAGNOSIS — R7303 Prediabetes: Secondary | ICD-10-CM

## 2023-05-18 DIAGNOSIS — Z7689 Persons encountering health services in other specified circumstances: Secondary | ICD-10-CM

## 2023-05-18 MED ORDER — CELECOXIB 200 MG PO CAPS: 200.0000 mg | ORAL_CAPSULE | Freq: Every day | ORAL | 1 refills | Status: DC

## 2023-05-18 NOTE — Progress Notes (Signed)
New Patient Office Visit  Subjective    Patient ID: Valerie Jenkins, female    DOB: [birth year]  Age: 62 y.o. MRN: [id number]  CC:  Chief Complaint  Patient presents with   Establish Care    Patient is here to establish care with new PCP, She would like to discuss the use of the medication  prilosec  With the possible side effects of memory fog.    HPI Valerie Jenkins presents to establish care. Pt is new to me.  GERD Pt reports she's been on Prilosec 40 mg daily for more than 4 years. She reports the last 6 months, she has memory issues. She is worried about memory issues with this medicine. She does have family hx of dementia in her father at the age of 14. She also reports a paternal uncle with dementia, paternal aunt with dementia.   Arthritis Pt has been on Celebrex 200mg  daily for arthritis. Needs this refilled. This has helped with her joint pains in her hand and knees.    Pt recently had labs done and her CPE with her OB. She is here for lab review. These were reviewed today and noted to have prediabetes.   Outpatient Encounter Medications as of 05/18/2023  Medication Sig   aspirin 81 MG chewable tablet Chew by mouth.   atorvastatin (LIPITOR) 10 MG tablet Take 1 tablet (10 mg total) by mouth daily.   azelastine (OPTIVAR) 0.05 % ophthalmic solution Place 1 drop into both eyes 2 (two) times daily.   EPINEPHrine (EPIPEN JR 2-PAK) 0.15 MG/0.3ML injection Inject 0.3 mLs (0.15 mg total) into the muscle as needed for anaphylaxis.   fluticasone (FLONASE) 50 MCG/ACT nasal spray USE 2 SPRAYS IN EACH NOSTRIL DAILY   losartan-hydrochlorothiazide (HYZAAR) 100-25 MG tablet TAKE 1 TABLET BY MOUTH EVERY DAY   montelukast (SINGULAIR) 10 MG tablet TAKE ONE TABLET BY MOUTH NIGHTLY AT BEDTIME   Vitamin D, Ergocalciferol, (DRISDOL) 1.25 MG (50000 UNIT) CAPS capsule Take 50,000 Units by mouth once a week.   [DISCONTINUED] celecoxib (CELEBREX) 200 MG capsule Take 200 mg by mouth daily.    [DISCONTINUED] omeprazole (PRILOSEC) 40 MG capsule TAKE 1 CAPSULE (40 MG TOTAL) BY MOUTH DAILY.   celecoxib (CELEBREX) 200 MG capsule Take 1 capsule (200 mg total) by mouth daily.   No facility-administered encounter medications on file as of 05/18/2023.    Past Medical History:  Diagnosis Date   Arthritis    GERD (gastroesophageal reflux disease)    Hyperlipidemia    Hypertension    Seasonal allergies     Past Surgical History:  Procedure Laterality Date   bartholin cystectomy     BARTHOLIN GLAND CYST EXCISION     CERVICAL POLYPECTOMY  1/84   CESAREAN SECTION  1996    Family History  Problem Relation Age of Onset   Diabetes Mother    Hypertension Mother    Hyperlipidemia Mother    Osteoporosis Mother    Diabetes Father    Hypertension Father    Hyperlipidemia Father    Stroke Father    Hypertension Sister    Arthritis Paternal Grandmother     Social History   Socioeconomic History   Marital status: Married    Spouse name: Not on file   Number of children: 2   Years of education: Not on file   Highest education level: Not on file  Occupational History   Not on file  Tobacco Use   Smoking status: Never  Smokeless tobacco: Never  Vaping Use   Vaping status: Never Used  Substance and Sexual Activity   Alcohol use: Yes    Alcohol/week: 1.0 standard drink of alcohol    Types: 1 Standard drinks or equivalent per week   Drug use: No   Sexual activity: Yes    Partners: Male  Other Topics Concern   Not on file  Social History Narrative   Not on file   Social Drivers of Health   Financial Resource Strain: Not on file  Food Insecurity: Not on file  Transportation Needs: Not on file  Physical Activity: Not on file  Stress: Not on file  Social Connections: Unknown (12/24/2021)   Received from [facility]   Social Network    Social Network: Not on file  Intimate Partner Violence: Unknown (12/24/2021)   Received from [facility]   HITS    Physically Hurt: Not on file    Insult or Talk Down To: Not on file    Threaten Physical Harm: Not on file    Scream or Curse: Not on file    Review of Systems  All other systems reviewed and are negative.       Objective    BP (!) 146/72   Pulse (!) 57   Temp (!) 97.5 F (36.4 C) (Oral)   Resp 18   Ht 5\' 5"  (1.651 m)   Wt 166 lb 6.4 oz (75.5 kg)   LMP 04/16/2016   SpO2 99%   BMI 27.69 kg/m   Physical Exam Vitals and nursing note reviewed.  Constitutional:      Appearance: Normal appearance. She is normal weight.  HENT:     Head: Normocephalic and atraumatic.     Right Ear: External ear normal.     Left Ear: External ear normal.     Nose: Nose normal.     Mouth/Throat:     Mouth: Mucous membranes are moist.     Pharynx: Oropharynx is clear.  Eyes:     Conjunctiva/sclera: Conjunctivae normal.     Pupils: Pupils are equal, round, and reactive to light.  Cardiovascular:     Rate and Rhythm: Normal rate and regular rhythm.     Pulses: Normal pulses.     Heart sounds: Normal heart sounds.  Pulmonary:     Effort: Pulmonary effort is normal.     Breath sounds: Normal breath sounds.  Skin:    General: Skin is warm.     Capillary Refill: Capillary refill takes less than 2 seconds.  Neurological:     General: No focal deficit present.     Mental Status: She is alert and oriented to person, place, and time. Mental status is at baseline.  Psychiatric:        Mood and Affect: Mood normal.        Behavior: Behavior normal.        Thought Content: Thought content normal.        Judgment: Judgment normal.       Assessment & Plan:   Problem List Items Addressed This Visit       Digestive   GERD (gastroesophageal reflux disease)   Other Visit Diagnoses       Encounter to establish care with new doctor    -  Primary     Family history of dementia         Generalized OA       Relevant Medications  celecoxib (CELEBREX) 200 MG capsule      Prediabetes         Encounter to establish care with new doctor  Gastroesophageal reflux disease without esophagitis  Family history of dementia  Generalized OA -     Celecoxib; Take 1 capsule (200 mg total) by mouth daily.  Dispense: 90 capsule; Refill: 1  Prediabetes   Pt advised she could use the PPI Omeprazole 40 mg as needed for now due to her fear of dementia as it runs in her family. She also has OA and needs refills on her Celebrex 200mg  daily. To monitor the prediabetes. Work on diet. Handout given today as food guide. Recheck in 3 months.    Return in about 3 months (around 08/16/2023) for Prediabetes.   Suzan Slick, MD

## 2023-05-19 ENCOUNTER — Encounter: Payer: Self-pay | Admitting: Family Medicine

## 2023-06-16 ENCOUNTER — Other Ambulatory Visit: Payer: Self-pay | Admitting: Family Medicine

## 2023-06-25 ENCOUNTER — Other Ambulatory Visit: Payer: Self-pay | Admitting: Family Medicine

## 2023-06-25 DIAGNOSIS — J302 Other seasonal allergic rhinitis: Secondary | ICD-10-CM

## 2023-06-25 DIAGNOSIS — Z1231 Encounter for screening mammogram for malignant neoplasm of breast: Secondary | ICD-10-CM

## 2023-08-07 ENCOUNTER — Ambulatory Visit
Admission: RE | Admit: 2023-08-07 | Discharge: 2023-08-07 | Disposition: A | Discharge status: Home / Self Care | Payer: Managed Care, Other (non HMO) | Source: Ambulatory Visit | Attending: Family Medicine | Admitting: Family Medicine

## 2023-08-07 DIAGNOSIS — Z1231 Encounter for screening mammogram for malignant neoplasm of breast: Secondary | ICD-10-CM

## 2023-08-12 ENCOUNTER — Encounter: Payer: Self-pay | Admitting: Obstetrics & Gynecology

## 2023-08-17 ENCOUNTER — Encounter: Payer: Self-pay | Admitting: Family Medicine

## 2023-08-17 ENCOUNTER — Ambulatory Visit: Payer: Managed Care, Other (non HMO) | Admitting: Family Medicine

## 2023-08-17 VITALS — BP 157/81 | HR 52 | Temp 97.4°F | Resp 18 | Ht 65.0 in | Wt 171.2 lb

## 2023-08-17 DIAGNOSIS — I10 Essential (primary) hypertension: Secondary | ICD-10-CM | POA: Diagnosis not present

## 2023-08-17 DIAGNOSIS — K219 Gastro-esophageal reflux disease without esophagitis: Secondary | ICD-10-CM | POA: Diagnosis not present

## 2023-08-17 DIAGNOSIS — E569 Vitamin deficiency, unspecified: Secondary | ICD-10-CM

## 2023-08-17 DIAGNOSIS — R7303 Prediabetes: Secondary | ICD-10-CM | POA: Diagnosis not present

## 2023-08-17 MED ORDER — OMEPRAZOLE 40 MG PO CPDR: 40.0000 mg | DELAYED_RELEASE_CAPSULE | Freq: Every day | ORAL | 1 refills | Status: DC

## 2023-08-17 MED ORDER — LOSARTAN POTASSIUM-HCTZ 100-12.5 MG PO TABS: 1.0000 | ORAL_TABLET | Freq: Every day | ORAL | 1 refills | Status: DC

## 2023-08-17 NOTE — Progress Notes (Signed)
 Established Patient Office Visit  Subjective   Patient ID: Valerie Jenkins, female    DOB: 1961/04/09  Age: 63 y.o. MRN: [id number]  Chief Complaint  Patient presents with   Medical Management of Chronic Issues    Patient is here for a 3 month follow up for pre diabetes    HPI  Pre-Diabetes Pt has been trying to manage this. She admits to not changing her diet much. She is here for follow up  Vitamin D deficiency She is on once a week vitamin D supplements. Asks if she needs to continue this.  Hypertension Pt taking Losartan/hydrochlorothiazide 50/12.5 mg daily. She has noted elevated blood pressures. She use to be on Losartan/hydrochlorothiazide 100/25 mg but this was decreased due to pt being dizzy.   GERD Pt was on Omeprazole 40 mg daily in the past. She stopped taking it. She is having more GERD symptoms despite Tums. Pt does admit to drinking coffee.    Review of Systems  Gastrointestinal:  Positive for heartburn.  All other systems reviewed and are negative.    Objective:     BP (!) 157/81   Pulse (!) 52   Temp (!) 97.4 F (36.3 C) (Oral)   Resp 18   Ht 5\' 5"  (1.651 m)   Wt 171 lb 3.2 oz (77.7 kg)   LMP 04/16/2016   SpO2 100%   BMI 28.49 kg/m  BP Readings from Last 3 Encounters:  08/17/23 (!) 157/81  05/18/23 (!) 146/72  05/11/23 (!) 162/92      Physical Exam Vitals and nursing note reviewed.  Constitutional:      Appearance: Normal appearance. She is normal weight.  HENT:     Head: Normocephalic and atraumatic.     Right Ear: External ear normal.     Left Ear: External ear normal.     Nose: Nose normal.     Mouth/Throat:     Mouth: Mucous membranes are moist.     Pharynx: Oropharynx is clear.  Eyes:     Conjunctiva/sclera: Conjunctivae normal.     Pupils: Pupils are equal, round, and reactive to light.  Cardiovascular:     Rate and Rhythm: Normal rate and regular rhythm.     Pulses: Normal pulses.     Heart sounds: Normal heart sounds.   Pulmonary:     Effort: Pulmonary effort is normal.     Breath sounds: Normal breath sounds.  Abdominal:     General: Abdomen is flat. Bowel sounds are normal.  Skin:    General: Skin is warm.     Capillary Refill: Capillary refill takes less than 2 seconds.  Neurological:     General: No focal deficit present.     Mental Status: She is alert and oriented to person, place, and time. Mental status is at baseline.  Psychiatric:        Mood and Affect: Mood normal.        Behavior: Behavior normal.        Thought Content: Thought content normal.        Judgment: Judgment normal.    No results found for any visits on 08/17/23.  Last hemoglobin A1c Lab Results  Component Value Date   HGBA1C 6.3 (H) 05/12/2023      The 10-year ASCVD risk score (Arnett DK, et al., 2019) is: 14.6%    Assessment & Plan:   Problem List Items Addressed This Visit   None  Primary hypertension -  Losartan Potassium-HCTZ; Take 1 tablet by mouth daily.  Dispense: 90 tablet; Refill: 1  Prediabetes -     Hemoglobin A1c  Vitamin deficiency -     VITAMIN D 25 Hydroxy (Vit-D Deficiency, Fractures)  Gastroesophageal reflux disease without esophagitis -     Omeprazole; Take 1 capsule (40 mg total) by mouth daily.  Dispense: 90 capsule; Refill: 1   Blood pressure not at goal. To increase Losartan/hydrochlorothiazide 50/12.5mg  to 100/12.5 mg daily. Monitor blood pressures outside the office and report to me readings in 2 weeks via mychart. To recheck A1c and vitamin D levels. Pt with worsening GERD, send Omeprazole 40mg  daily. Avoid triggers such as coffee.  No follow-ups on file.    Suzan Slick, MD

## 2023-08-18 ENCOUNTER — Encounter: Payer: Self-pay | Admitting: Family Medicine

## 2023-08-18 LAB — HEMOGLOBIN A1C
Est. average glucose Bld gHb Est-mCnc: 126 mg/dL
Hgb A1c MFr Bld: 6 % — ABNORMAL HIGH (ref 4.8–5.6)

## 2023-08-18 LAB — VITAMIN D 25 HYDROXY (VIT D DEFICIENCY, FRACTURES): Vit D, 25-Hydroxy: 66 ng/mL (ref 30.0–100.0)

## 2023-08-27 ENCOUNTER — Encounter: Payer: Self-pay | Admitting: Family Medicine

## 2023-11-17 ENCOUNTER — Ambulatory Visit: Admitting: Family Medicine

## 2023-11-20 ENCOUNTER — Ambulatory Visit: Admitting: Family Medicine

## 2023-11-20 ENCOUNTER — Encounter: Payer: Self-pay | Admitting: Family Medicine

## 2023-11-20 VITALS — BP 112/65 | HR 64 | Temp 97.7°F | Resp 18 | Ht 65.0 in | Wt 170.3 lb

## 2023-11-20 DIAGNOSIS — M25542 Pain in joints of left hand: Secondary | ICD-10-CM | POA: Diagnosis not present

## 2023-11-20 DIAGNOSIS — M25541 Pain in joints of right hand: Secondary | ICD-10-CM | POA: Diagnosis not present

## 2023-11-20 DIAGNOSIS — I10 Essential (primary) hypertension: Secondary | ICD-10-CM | POA: Diagnosis not present

## 2023-11-20 NOTE — Progress Notes (Signed)
 Established Patient Office Visit  Subjective   Patient ID: Valerie Jenkins, female    DOB: 06/23/1960  Age: 63 y.o. MRN: [id number]  Chief Complaint  Patient presents with   Follow-up    Patient is here for a 3 month follow up, patient states that the increase in her BP medication is working.    Back Pain    Patient complains of back pain after lifting a flower pot , she states that Wednesday her mid back started hurting and by Thursday the pain was worse. She states that she used a heating pad and that seemed to help but she states that her back is still tender.    hand stiffness    Patient believes she may have rheumatoid arthritis in her hands she states that when she wakes up her hands are very stiff.    Back Pain    Hypertension Pt is here for follow up of HTN. She had her Zestoretic 50/12.5mg  increased to 100/12.5mg  daily. She has been doing well with this increase and bp at goal. Tolerating medicine well.   Back pain Started this past Wednesday. She lifted a heavy flower pot last weekend. She has been doing heat and otc pain patches which helped.  Hand pain She reports this has been happening for a while. She has had this for the last 9 months. She wakes up with hand stiffness but as she rubs them, this gets better. She has tried tylenol in the past and this seems not to help. She has family hx of arthritis in her mother and maternal GM.   Review of Systems  Musculoskeletal:  Positive for back pain and joint pain.  All other systems reviewed and are negative.     Objective:     BP 112/65   Pulse 64   Temp 97.7 F (36.5 C) (Oral)   Resp 18   Ht 5' 5 (1.651 m)   Wt 170 lb 4.8 oz (77.2 kg)   LMP 04/16/2016   SpO2 99%   BMI 28.34 kg/m  BP Readings from Last 3 Encounters:  11/20/23 112/65  08/17/23 (!) 157/81  05/18/23 (!) 146/72      Physical Exam Vitals and nursing note reviewed.  Constitutional:      Appearance: Normal appearance. She is normal  weight.  HENT:     Head: Normocephalic and atraumatic.     Right Ear: External ear normal.     Left Ear: External ear normal.     Nose: Nose normal.     Mouth/Throat:     Mouth: Mucous membranes are moist.     Pharynx: Oropharynx is clear.   Eyes:     Conjunctiva/sclera: Conjunctivae normal.     Pupils: Pupils are equal, round, and reactive to light.    Cardiovascular:     Rate and Rhythm: Normal rate.  Pulmonary:     Effort: Pulmonary effort is normal.  Abdominal:     General: Bowel sounds are normal.   Skin:    General: Skin is warm.     Capillary Refill: Capillary refill takes less than 2 seconds.   Neurological:     General: No focal deficit present.     Mental Status: She is alert and oriented to person, place, and time. Mental status is at baseline.   Psychiatric:        Mood and Affect: Mood normal.        Behavior: Behavior normal.  Thought Content: Thought content normal.        Judgment: Judgment normal.      No results found for any visits on 11/20/23.     The 10-year ASCVD risk score (Arnett DK, et al., 2019) is: 7%    Assessment & Plan:   Problem List Items Addressed This Visit   None  Primary hypertension  Arthralgia of both hands -     ANA w/Reflex if Positive -     CYCLIC CITRUL PEPTIDE ANTIBODY, IGG/IGA -     Rheumatoid factor   Pt with HTN controlled. Continue current regimen Zestoretic 100/12.5mg  daily. Pt with arthralgia in hands. Has family hx of arthritis. Screening for autoimmune disease such as RA.  No follow-ups on file.    Manette Section, MD

## 2023-11-25 ENCOUNTER — Ambulatory Visit: Payer: Self-pay | Admitting: Family Medicine

## 2023-11-25 LAB — RHEUMATOID FACTOR: Rheumatoid fact SerPl-aCnc: <10 [IU]/mL (ref <14.0)

## 2023-11-25 LAB — ANA W/REFLEX IF POSITIVE: Anti Nuclear Antibody (ANA): NEGATIVE

## 2023-11-25 LAB — CYCLIC CITRUL PEPTIDE ANTIBODY, IGG/IGA: Cyclic Citrullin Peptide Ab: 7 U (ref 0–19)

## 2023-11-27 ENCOUNTER — Other Ambulatory Visit: Payer: Self-pay | Admitting: Family Medicine

## 2024-01-05 ENCOUNTER — Other Ambulatory Visit: Payer: Self-pay | Admitting: Family Medicine

## 2024-01-05 DIAGNOSIS — M159 Polyosteoarthritis, unspecified: Secondary | ICD-10-CM

## 2024-01-26 ENCOUNTER — Ambulatory Visit: Payer: Self-pay | Admitting: *Deleted

## 2024-01-26 NOTE — Telephone Encounter (Signed)
 Noted and Aware

## 2024-01-26 NOTE — Telephone Encounter (Signed)
 Copied from CRM #[id number]. Topic: Clinical - Red Word Triage >> Jan 26, 2024  8:13 AM Valerie Jenkins wrote: Red Word that prompted transfer to Nurse Triage: Patient is experiencing a possible sinus or ear infection for the last 9 days. She has severe throat pain and on the left side of her head, congestion and ear pain. She has tried over the counter medication but nothing seems to help. Reason for Disposition  [1] Sinus congestion (pressure, fullness) AND [2] present > 10 days  Answer Assessment - Initial Assessment Questions 1. LOCATION: Where does it hurt?      I've had this for 9 days now.   I'm real congested and my scalp is real sore on one side.   I'm having pain in my temple area.   It's stinging sensation.     It's all on the right side.   I'm so congested.   My right ear hurts too. 2. ONSET: When did the sinus pain start?  (e.g., hours, days)      9 days ago It's not going away.   I've tried Mucinex  and Tylenol for the pain on side of my scalp.    3. SEVERITY: How bad is the pain?   (Scale 0-10; or none, mild, moderate or severe)     The sore throat is not as bad but last week it was bad.    I'm having drainage down the back of my throat and coughing a little light white color. No fever My head feels real stuffy.    4. RECURRENT SYMPTOM: Have you ever had sinus problems before? If Yes, ask: When was the last time? and What happened that time?      No 5. NASAL CONGESTION: Is the nose blocked? If Yes, ask: Can you open it or must you breathe through your mouth?     Yes I take Singulair  daily 6. NASAL DISCHARGE: Do you have discharge from your nose? If so ask, What color?     Yes  It's clear. 7. FEVER: Do you have a fever? If Yes, ask: What is it, how was it measured, and when did it start?      No 8. OTHER SYMPTOMS: Do you have any other symptoms? (e.g., sore throat, cough, earache, difficulty breathing)     Ear, coughing, sinus congestion 9. PREGNANCY: Is there  any chance you are pregnant? When was your last menstrual period?     N/A due to age  Protocols used: Sinus Pain or Congestion-A-AH Pt has been placed on wait list in case of a cancellation with Dr. Colette before Tamara. Appt.   Please call.  She doesn't use MyChart that often.     FYI Only or Action Required?: FYI only for provider.  Patient was last seen in primary care on 11/20/2023 by Colette Torrence GRADE, MD.  Called Nurse Triage reporting URI.  Symptoms began several weeks ago.  Interventions attempted: OTC medications: Mucinex  and Tylenol.  Symptoms are: unchanged. Sinus congestion, sore throat, right side of head is very congested not going away.  Triage Disposition: See PCP When Office is Open (Within 3 Days)  Patient/caregiver understands and will follow disposition?: Yes

## 2024-01-28 ENCOUNTER — Ambulatory Visit: Admitting: Family Medicine

## 2024-01-28 ENCOUNTER — Encounter: Payer: Self-pay | Admitting: Family Medicine

## 2024-01-28 VITALS — BP 123/75 | HR 62 | Temp 98.6°F | Resp 18 | Ht 63.0 in | Wt 168.6 lb

## 2024-01-28 DIAGNOSIS — J309 Allergic rhinitis, unspecified: Secondary | ICD-10-CM

## 2024-01-28 MED ORDER — METHYLPREDNISOLONE ACETATE 40 MG/ML IJ SUSP
40.0000 mg | Freq: Once | INTRAMUSCULAR | Status: AC
  Administered 2024-01-28: 40 mg via INTRAMUSCULAR

## 2024-01-28 NOTE — Progress Notes (Signed)
 Acute Office Visit  Subjective:     Patient ID: Valerie Jenkins, female    DOB: [birth year], 63 y.o.   MRN: [id number]  Chief Complaint  Patient presents with   Sinus Problem    Patient states that for the past nine days she has had right sided sinus congestion that affected her whole right side.  She states that the congestion is getting better,  Her cough has been a little productive.    Sinus Problem Associated symptoms include coughing, ear pain and a sore throat.   Patient is in today for acute visit.   Pt reports for the last 9 days, she's had nasal congestion. She says her throat was really sore last week that comes and goes. She has right ear feeling plugged. She had right sided temple pain. She has tried otc medicines not helping. Denies fever or chills. Has hx of allergies and taking Singulair  10 mg daily.  Review of Systems  HENT:  Positive for ear pain, sinus pain and sore throat.   Respiratory:  Positive for cough.   All other systems reviewed and are negative.       Objective:    BP 123/75   Pulse 62   Temp 98.6 F (37 C) (Oral)   Resp 18   Ht 5' 3 (1.6 m)   Wt 168 lb 9.6 oz (76.5 kg)   LMP 04/16/2016   SpO2 100%   BMI 29.87 kg/m  BP Readings from Last 3 Encounters:  01/28/24 123/75  11/20/23 112/65  08/17/23 (!) 157/81      Physical Exam Vitals and nursing note reviewed.  Constitutional:      Appearance: Normal appearance. She is normal weight.  HENT:     Head: Normocephalic and atraumatic.     Right Ear: Tympanic membrane, ear canal and external ear normal.     Left Ear: Tympanic membrane, ear canal and external ear normal.     Nose: Nose normal.     Mouth/Throat:     Mouth: Mucous membranes are moist.     Pharynx: Oropharynx is clear.  Eyes:     Conjunctiva/sclera: Conjunctivae normal.     Pupils: Pupils are equal, round, and reactive to light.  Cardiovascular:     Rate and Rhythm: Normal rate and regular rhythm.     Pulses: Normal  pulses.     Heart sounds: Normal heart sounds.  Pulmonary:     Effort: Pulmonary effort is normal.     Breath sounds: Normal breath sounds.  Abdominal:     General: Abdomen is flat. Bowel sounds are normal.  Skin:    General: Skin is warm.     Capillary Refill: Capillary refill takes less than 2 seconds.  Neurological:     General: No focal deficit present.     Mental Status: She is alert and oriented to person, place, and time. Mental status is at baseline.  Psychiatric:        Mood and Affect: Mood normal.        Behavior: Behavior normal.        Thought Content: Thought content normal.        Judgment: Judgment normal.    No results found for any visits on 01/28/24.      Assessment & Plan:   Problem List Items Addressed This Visit       Respiratory   AR (allergic rhinitis) - Primary   Relevant Medications   methylPREDNISolone  acetate (DEPO-MEDROL ) injection 40  mg (Start on 01/28/2024 11:30 AM)    Meds ordered this encounter  Medications   methylPREDNISolone  acetate (DEPO-MEDROL ) injection 40 mg   Allergic rhinitis, unspecified seasonality, unspecified trigger -     methylPREDNISolone  Acetate   Symptoms most consistent with allergic rhinitis. Advised to try otc Mucinex  sinus max and will give one time dose Depomedrol 40mg  IM x 1. No follow-ups on file.  Torrence CINDERELLA Barrier, MD

## 2024-01-30 ENCOUNTER — Other Ambulatory Visit: Payer: Self-pay | Admitting: Family Medicine

## 2024-01-30 DIAGNOSIS — I10 Essential (primary) hypertension: Secondary | ICD-10-CM

## 2024-02-01 ENCOUNTER — Other Ambulatory Visit: Payer: Self-pay | Admitting: Family Medicine

## 2024-02-01 DIAGNOSIS — K219 Gastro-esophageal reflux disease without esophagitis: Secondary | ICD-10-CM

## 2024-05-07 ENCOUNTER — Other Ambulatory Visit: Payer: Self-pay | Admitting: Family Medicine

## 2024-05-23 ENCOUNTER — Encounter: Admitting: Family Medicine

## 2024-06-23 ENCOUNTER — Other Ambulatory Visit: Payer: Self-pay | Admitting: Family Medicine
